# Patient Record
Sex: Female | Born: 1985
Health system: Southern US, Community
[De-identification: ages and names within clinical notes are randomized; demographics above are authoritative.]

## PROBLEM LIST (undated history)

## (undated) DIAGNOSIS — E282 Polycystic ovarian syndrome: Secondary | ICD-10-CM

## (undated) DIAGNOSIS — R42 Dizziness and giddiness: Secondary | ICD-10-CM

## (undated) DIAGNOSIS — R0602 Shortness of breath: Secondary | ICD-10-CM

## (undated) DIAGNOSIS — R12 Heartburn: Secondary | ICD-10-CM

## (undated) DIAGNOSIS — J45909 Unspecified asthma, uncomplicated: Secondary | ICD-10-CM

## (undated) DIAGNOSIS — M549 Dorsalgia, unspecified: Secondary | ICD-10-CM

## (undated) DIAGNOSIS — K30 Functional dyspepsia: Secondary | ICD-10-CM

## (undated) DIAGNOSIS — L509 Urticaria, unspecified: Secondary | ICD-10-CM

## (undated) DIAGNOSIS — F410 Panic disorder [episodic paroxysmal anxiety] without agoraphobia: Secondary | ICD-10-CM

## (undated) DIAGNOSIS — J069 Acute upper respiratory infection, unspecified: Secondary | ICD-10-CM

## (undated) DIAGNOSIS — F329 Major depressive disorder, single episode, unspecified: Secondary | ICD-10-CM

## (undated) DIAGNOSIS — I2510 Atherosclerotic heart disease of native coronary artery without angina pectoris: Secondary | ICD-10-CM

## (undated) DIAGNOSIS — R079 Chest pain, unspecified: Secondary | ICD-10-CM

## (undated) DIAGNOSIS — F419 Anxiety disorder, unspecified: Secondary | ICD-10-CM

## (undated) DIAGNOSIS — F32A Depression, unspecified: Secondary | ICD-10-CM

## (undated) DIAGNOSIS — I10 Essential (primary) hypertension: Secondary | ICD-10-CM

## (undated) DIAGNOSIS — K5900 Constipation, unspecified: Secondary | ICD-10-CM

## (undated) DIAGNOSIS — M7989 Other specified soft tissue disorders: Secondary | ICD-10-CM

## (undated) DIAGNOSIS — R002 Palpitations: Secondary | ICD-10-CM

## (undated) DIAGNOSIS — G43909 Migraine, unspecified, not intractable, without status migrainosus: Secondary | ICD-10-CM

## (undated) DIAGNOSIS — L309 Dermatitis, unspecified: Secondary | ICD-10-CM

## (undated) HISTORY — DX: Dizziness and giddiness: R42

## (undated) HISTORY — DX: Functional dyspepsia: K30

## (undated) HISTORY — DX: Heartburn: R12

## (undated) HISTORY — DX: Panic disorder (episodic paroxysmal anxiety): F41.0

## (undated) HISTORY — DX: Shortness of breath: R06.02

## (undated) HISTORY — DX: Dermatitis, unspecified: L30.9

## (undated) HISTORY — DX: Other specified soft tissue disorders: M79.89

## (undated) HISTORY — DX: Palpitations: R00.2

## (undated) HISTORY — PX: WISDOM TOOTH EXTRACTION: SHX21

## (undated) HISTORY — DX: Polycystic ovarian syndrome: E28.2

## (undated) HISTORY — PX: TONSILLECTOMY: SUR1361

## (undated) HISTORY — DX: Migraine, unspecified, not intractable, without status migrainosus: G43.909

## (undated) HISTORY — DX: Depression, unspecified: F32.A

## (undated) HISTORY — DX: Acute upper respiratory infection, unspecified: J06.9

## (undated) HISTORY — DX: Constipation, unspecified: K59.00

## (undated) HISTORY — DX: Anxiety disorder, unspecified: F41.9

## (undated) HISTORY — DX: Urticaria, unspecified: L50.9

## (undated) HISTORY — DX: Dorsalgia, unspecified: M54.9

## (undated) HISTORY — DX: Chest pain, unspecified: R07.9

---
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
## 2014-06-17 ENCOUNTER — Emergency Department (HOSPITAL_BASED_OUTPATIENT_CLINIC_OR_DEPARTMENT_OTHER)
Admission: EM | Admit: 2014-06-17 | Discharge: 2014-06-17 | Disposition: A | Discharge status: Home / Self Care | Payer: 59 | Attending: Emergency Medicine | Admitting: Emergency Medicine

## 2014-06-17 ENCOUNTER — Encounter (HOSPITAL_BASED_OUTPATIENT_CLINIC_OR_DEPARTMENT_OTHER): Payer: Self-pay

## 2014-06-17 DIAGNOSIS — I10 Essential (primary) hypertension: Secondary | ICD-10-CM | POA: Insufficient documentation

## 2014-06-17 DIAGNOSIS — Y93G1 Activity, food preparation and clean up: Secondary | ICD-10-CM | POA: Insufficient documentation

## 2014-06-17 DIAGNOSIS — S29012A Strain of muscle and tendon of back wall of thorax, initial encounter: Secondary | ICD-10-CM | POA: Insufficient documentation

## 2014-06-17 DIAGNOSIS — Y9289 Other specified places as the place of occurrence of the external cause: Secondary | ICD-10-CM | POA: Insufficient documentation

## 2014-06-17 DIAGNOSIS — Y998 Other external cause status: Secondary | ICD-10-CM | POA: Diagnosis not present

## 2014-06-17 DIAGNOSIS — Z79899 Other long term (current) drug therapy: Secondary | ICD-10-CM | POA: Diagnosis not present

## 2014-06-17 DIAGNOSIS — J45909 Unspecified asthma, uncomplicated: Secondary | ICD-10-CM | POA: Insufficient documentation

## 2014-06-17 DIAGNOSIS — S24109A Unspecified injury at unspecified level of thoracic spinal cord, initial encounter: Secondary | ICD-10-CM | POA: Diagnosis present

## 2014-06-17 DIAGNOSIS — X58XXXA Exposure to other specified factors, initial encounter: Secondary | ICD-10-CM | POA: Diagnosis not present

## 2014-06-17 DIAGNOSIS — S29019A Strain of muscle and tendon of unspecified wall of thorax, initial encounter: Secondary | ICD-10-CM

## 2014-06-17 HISTORY — DX: Unspecified asthma, uncomplicated: J45.909

## 2014-06-17 HISTORY — DX: Essential (primary) hypertension: I10

## 2014-06-17 MED ORDER — METHOCARBAMOL 500 MG PO TABS: 500.0000 mg | ORAL_TABLET | Freq: Two times a day (BID) | ORAL | Status: DC

## 2014-06-17 MED ORDER — HYDROCODONE-ACETAMINOPHEN 5-325 MG PO TABS: 2.0000 | ORAL_TABLET | ORAL | Status: DC | PRN

## 2014-06-17 MED ORDER — HYDROMORPHONE HCL 1 MG/ML IJ SOLN
1.0000 mg | Freq: Once | INTRAMUSCULAR | Status: AC
  Administered 2014-06-17: 1 mg via INTRAMUSCULAR
  Filled 2014-06-17: qty 1

## 2014-06-17 MED ORDER — ONDANSETRON 8 MG PO TBDP
8.0000 mg | ORAL_TABLET | Freq: Once | ORAL | Status: AC
  Administered 2014-06-17: 8 mg via ORAL
  Filled 2014-06-17: qty 1

## 2014-06-17 MED ORDER — DIAZEPAM 5 MG PO TABS
5.0000 mg | ORAL_TABLET | Freq: Once | ORAL | Status: AC
  Administered 2014-06-17: 5 mg via ORAL
  Filled 2014-06-17: qty 1

## 2014-06-17 NOTE — ED Provider Notes (Signed)
CSN: [id number]     Arrival date & time 06/17/14  1000 History   First MD Initiated Contact with Patient 06/17/14 1012     Chief Complaint  Patient presents with  . Back Pain     (Consider location/radiation/quality/duration/timing/severity/associated sxs/prior Treatment) HPI Comments: Patient presents to the ER for evaluation of back pain. Patient reports that she felt a twinge of pain when she turned while washing dishes 2 days ago. Since then, however, the area is tight but has become severe. Patient reports constant dull aching pain in the area, but when she moves in certain positions she has severe, sharp and stabbing pain. No chest pain or shortness of breath.  Patient is a 29 y.o. female presenting with back pain.  Back Pain   Past Medical History  Diagnosis Date  . Asthma   . Hypertension    Past Surgical History  Procedure Laterality Date  . Tonsillectomy     History reviewed. No pertinent family history. History  Substance Use Topics  . Smoking status: Never Smoker   . Smokeless tobacco: Not on file  . Alcohol Use: Yes     Comment: occasionally   OB History    No data available     Review of Systems  Musculoskeletal: Positive for back pain.  All other systems reviewed and are negative.     Allergies  Review of patient's allergies indicates no known allergies.  Home Medications   Prior to Admission medications   Medication Sig Start Date End Date Taking? Authorizing Provider  albuterol (PROVENTIL HFA;VENTOLIN HFA) 108 (90 BASE) MCG/ACT inhaler Inhale into the lungs every 6 (six) hours as needed for wheezing or shortness of breath.   Yes Historical Provider, MD  lisinopril (PRINIVIL,ZESTRIL) 5 MG tablet Take 5 mg by mouth daily.   Yes Historical Provider, MD   BP 134/69 mmHg  Pulse 80  Temp(Src) 98.4 F (36.9 C) (Oral)  Resp 18  Ht 5\' 3"  (1.6 m)  Wt 217 lb (98.431 kg)  BMI 38.45 kg/m2  SpO2 97%  LMP 05/09/2014 Physical Exam  Constitutional: She  is oriented to person, place, and time. She appears well-developed and well-nourished. No distress.  HENT:  Head: Normocephalic and atraumatic.  Right Ear: Hearing normal.  Left Ear: Hearing normal.  Nose: Nose normal.  Mouth/Throat: Oropharynx is clear and moist and mucous membranes are normal.  Eyes: Conjunctivae and EOM are normal. Pupils are equal, round, and reactive to light.  Neck: Normal range of motion. Neck supple.  Cardiovascular: Regular rhythm, S1 normal and S2 normal.  Exam reveals no gallop and no friction rub.   No murmur heard. Pulmonary/Chest: Effort normal and breath sounds normal. No respiratory distress. She exhibits no tenderness.  Abdominal: Soft. Normal appearance and bowel sounds are normal. There is no hepatosplenomegaly. There is no tenderness. There is no rebound, no guarding, no tenderness at McBurney's point and negative Murphy's sign. No hernia.  Musculoskeletal: Normal range of motion.       Thoracic back: She exhibits tenderness.       Back:  Neurological: She is alert and oriented to person, place, and time. She has normal strength. No cranial nerve deficit or sensory deficit. Coordination normal. GCS eye subscore is 4. GCS verbal subscore is 5. GCS motor subscore is 6.  Skin: Skin is warm, dry and intact. No rash noted. No cyanosis.  Psychiatric: She has a normal mood and affect. Her speech is normal and behavior is normal. Thought content normal.  Nursing note and vitals reviewed.   ED Course  Procedures (including critical care time) Labs Review Labs Reviewed - No data to display  Imaging Review No results found.   EKG Interpretation None      MDM   Final diagnoses:  None   thoracic strain  Patient presents to the ER with musculoskeletal back pain. Examination reveals back tenderness without any associated neurologic findings. Patient's strength, sensation and reflexes were normal. As such, patient did not require any imaging or further  studies. Patient was treated with analgesia.    Gilda Crease, MD 06/17/14 1228

## 2014-06-17 NOTE — Discharge Instructions (Signed)
Back Pain, Adult °Back pain is very common. The pain often gets better over time. The cause of back pain is usually not dangerous. Most people can learn to manage their back pain on their own.  °HOME CARE  °· Stay active. Start with short walks on flat ground if you can. Try to walk farther each day. °· Do not sit, drive, or stand in one place for more than 30 minutes. Do not stay in bed. °· Do not avoid exercise or work. Activity can help your back heal faster. °· Be careful when you bend or lift an object. Bend at your knees, keep the object close to you, and do not twist. °· Sleep on a firm mattress. Lie on your side, and bend your knees. If you lie on your back, put a pillow under your knees. °· Only take medicines as told by your doctor. °· Put ice on the injured area. °¨ Put ice in a plastic bag. °¨ Place a towel between your skin and the bag. °¨ Leave the ice on for 15-20 minutes, 03-04 times a day for the first 2 to 3 days. After that, you can switch between ice and heat packs. °· Ask your doctor about back exercises or massage. °· Avoid feeling anxious or stressed. Find good ways to deal with stress, such as exercise. °GET HELP RIGHT AWAY IF:  °· Your pain does not go away with rest or medicine. °· Your pain does not go away in 1 week. °· You have new problems. °· You do not feel well. °· The pain spreads into your legs. °· You cannot control when you poop (bowel movement) or pee (urinate). °· Your arms or legs feel weak or lose feeling (numbness). °· You feel sick to your stomach (nauseous) or throw up (vomit). °· You have belly (abdominal) pain. °· You feel like you may pass out (faint). °MAKE SURE YOU:  °· Understand these instructions. °· Will watch your condition. °· Will get help right away if you are not doing well or get worse. °Document Released: 10/09/2007 Document Revised: 07/15/2011 Document Reviewed: 08/24/2013 °[employer]® Patient Information ©2015 [employer]. This information is not intended  to replace advice given to you by your health care provider. Make sure you discuss any questions you have with your health care provider. ° °

## 2014-06-17 NOTE — ED Notes (Addendum)
Pt reports middle back pain that started this morning - noted pain began when she was washing dishes (twisting motion)

## 2015-04-25 ENCOUNTER — Other Ambulatory Visit: Payer: Self-pay | Admitting: Family Medicine

## 2015-04-25 ENCOUNTER — Ambulatory Visit
Admission: RE | Admit: 2015-04-25 | Discharge: 2015-04-25 | Disposition: A | Discharge status: Home / Self Care | Payer: 59 | Source: Ambulatory Visit | Attending: Family Medicine | Admitting: Family Medicine

## 2015-04-25 DIAGNOSIS — W540XXA Bitten by dog, initial encounter: Secondary | ICD-10-CM

## 2017-02-06 DIAGNOSIS — R350 Frequency of micturition: Secondary | ICD-10-CM | POA: Diagnosis not present

## 2017-02-21 DIAGNOSIS — Z118 Encounter for screening for other infectious and parasitic diseases: Secondary | ICD-10-CM | POA: Diagnosis not present

## 2017-02-21 DIAGNOSIS — N920 Excessive and frequent menstruation with regular cycle: Secondary | ICD-10-CM | POA: Diagnosis not present

## 2017-02-21 DIAGNOSIS — Z0000 Encounter for general adult medical examination without abnormal findings: Secondary | ICD-10-CM | POA: Diagnosis not present

## 2017-02-21 DIAGNOSIS — Z13220 Encounter for screening for lipoid disorders: Secondary | ICD-10-CM | POA: Diagnosis not present

## 2017-02-21 DIAGNOSIS — Z131 Encounter for screening for diabetes mellitus: Secondary | ICD-10-CM | POA: Diagnosis not present

## 2017-02-21 DIAGNOSIS — Z130 Encounter for screening for diseases of the blood and blood-forming organs and certain disorders involving the immune mechanism: Secondary | ICD-10-CM | POA: Diagnosis not present

## 2017-02-21 DIAGNOSIS — Z01419 Encounter for gynecological examination (general) (routine) without abnormal findings: Secondary | ICD-10-CM | POA: Diagnosis not present

## 2017-06-13 DIAGNOSIS — G43109 Migraine with aura, not intractable, without status migrainosus: Secondary | ICD-10-CM | POA: Diagnosis not present

## 2017-06-13 DIAGNOSIS — J45990 Exercise induced bronchospasm: Secondary | ICD-10-CM | POA: Diagnosis not present

## 2018-01-07 DIAGNOSIS — G43109 Migraine with aura, not intractable, without status migrainosus: Secondary | ICD-10-CM | POA: Diagnosis not present

## 2018-01-07 DIAGNOSIS — J45990 Exercise induced bronchospasm: Secondary | ICD-10-CM | POA: Diagnosis not present

## 2018-01-13 DIAGNOSIS — L989 Disorder of the skin and subcutaneous tissue, unspecified: Secondary | ICD-10-CM | POA: Diagnosis not present

## 2018-01-15 DIAGNOSIS — Z6836 Body mass index (BMI) 36.0-36.9, adult: Secondary | ICD-10-CM | POA: Diagnosis not present

## 2018-01-15 DIAGNOSIS — E669 Obesity, unspecified: Secondary | ICD-10-CM | POA: Diagnosis not present

## 2018-02-20 DIAGNOSIS — Z130 Encounter for screening for diseases of the blood and blood-forming organs and certain disorders involving the immune mechanism: Secondary | ICD-10-CM | POA: Diagnosis not present

## 2018-02-20 DIAGNOSIS — Z13220 Encounter for screening for lipoid disorders: Secondary | ICD-10-CM | POA: Diagnosis not present

## 2018-02-20 DIAGNOSIS — Z131 Encounter for screening for diabetes mellitus: Secondary | ICD-10-CM | POA: Diagnosis not present

## 2018-02-20 DIAGNOSIS — Z0000 Encounter for general adult medical examination without abnormal findings: Secondary | ICD-10-CM | POA: Diagnosis not present

## 2018-02-20 DIAGNOSIS — Z1329 Encounter for screening for other suspected endocrine disorder: Secondary | ICD-10-CM | POA: Diagnosis not present

## 2018-02-23 DIAGNOSIS — Z01419 Encounter for gynecological examination (general) (routine) without abnormal findings: Secondary | ICD-10-CM | POA: Diagnosis not present

## 2018-02-23 DIAGNOSIS — Z118 Encounter for screening for other infectious and parasitic diseases: Secondary | ICD-10-CM | POA: Diagnosis not present

## 2018-02-23 DIAGNOSIS — Z6836 Body mass index (BMI) 36.0-36.9, adult: Secondary | ICD-10-CM | POA: Diagnosis not present

## 2018-04-20 DIAGNOSIS — R350 Frequency of micturition: Secondary | ICD-10-CM | POA: Diagnosis not present

## 2018-05-04 DIAGNOSIS — Z118 Encounter for screening for other infectious and parasitic diseases: Secondary | ICD-10-CM | POA: Diagnosis not present

## 2018-05-04 DIAGNOSIS — Z1159 Encounter for screening for other viral diseases: Secondary | ICD-10-CM | POA: Diagnosis not present

## 2018-06-22 ENCOUNTER — Other Ambulatory Visit (HOSPITAL_BASED_OUTPATIENT_CLINIC_OR_DEPARTMENT_OTHER): Payer: Self-pay | Admitting: Obstetrics and Gynecology

## 2018-06-22 ENCOUNTER — Other Ambulatory Visit: Payer: Self-pay | Admitting: Obstetrics and Gynecology

## 2018-06-22 ENCOUNTER — Ambulatory Visit (HOSPITAL_BASED_OUTPATIENT_CLINIC_OR_DEPARTMENT_OTHER)
Admission: RE | Admit: 2018-06-22 | Discharge: 2018-06-22 | Disposition: A | Discharge status: Home / Self Care | Payer: 59 | Source: Ambulatory Visit | Attending: Obstetrics and Gynecology | Admitting: Obstetrics and Gynecology

## 2018-06-22 DIAGNOSIS — R1013 Epigastric pain: Secondary | ICD-10-CM | POA: Diagnosis not present

## 2018-06-23 ENCOUNTER — Other Ambulatory Visit: Payer: Self-pay

## 2018-06-25 ENCOUNTER — Other Ambulatory Visit: Payer: Self-pay | Admitting: Obstetrics and Gynecology

## 2018-06-25 ENCOUNTER — Ambulatory Visit
Admission: RE | Admit: 2018-06-25 | Discharge: 2018-06-25 | Disposition: A | Discharge status: Home / Self Care | Payer: 59 | Source: Ambulatory Visit | Attending: Obstetrics and Gynecology | Admitting: Obstetrics and Gynecology

## 2018-06-25 DIAGNOSIS — R0602 Shortness of breath: Secondary | ICD-10-CM | POA: Diagnosis not present

## 2018-06-25 DIAGNOSIS — R079 Chest pain, unspecified: Secondary | ICD-10-CM

## 2018-06-26 DIAGNOSIS — R1011 Right upper quadrant pain: Secondary | ICD-10-CM | POA: Diagnosis not present

## 2018-06-26 DIAGNOSIS — R079 Chest pain, unspecified: Secondary | ICD-10-CM | POA: Diagnosis not present

## 2018-07-01 ENCOUNTER — Other Ambulatory Visit: Payer: Self-pay | Admitting: Obstetrics and Gynecology

## 2018-07-02 ENCOUNTER — Other Ambulatory Visit: Payer: Self-pay | Admitting: Gastroenterology

## 2018-07-02 DIAGNOSIS — R1011 Right upper quadrant pain: Secondary | ICD-10-CM | POA: Diagnosis not present

## 2018-07-02 DIAGNOSIS — K219 Gastro-esophageal reflux disease without esophagitis: Secondary | ICD-10-CM | POA: Diagnosis not present

## 2018-07-13 ENCOUNTER — Ambulatory Visit (HOSPITAL_COMMUNITY): Payer: 59

## 2018-07-17 ENCOUNTER — Other Ambulatory Visit: Payer: Self-pay

## 2018-07-17 ENCOUNTER — Encounter (HOSPITAL_COMMUNITY)
Admission: RE | Admit: 2018-07-17 | Discharge: 2018-07-17 | Disposition: A | Discharge status: Home / Self Care | Payer: 59 | Source: Ambulatory Visit | Attending: Gastroenterology | Admitting: Gastroenterology

## 2018-07-17 DIAGNOSIS — R1010 Upper abdominal pain, unspecified: Secondary | ICD-10-CM | POA: Diagnosis not present

## 2018-07-17 DIAGNOSIS — R1011 Right upper quadrant pain: Secondary | ICD-10-CM | POA: Diagnosis present

## 2018-07-17 MED ORDER — TECHNETIUM TC 99M MEBROFENIN IV KIT
5.3000 | PACK | Freq: Once | INTRAVENOUS | Status: AC | PRN
  Administered 2018-07-17: 5.3 via INTRAVENOUS

## 2018-08-17 DIAGNOSIS — R6889 Other general symptoms and signs: Secondary | ICD-10-CM | POA: Diagnosis not present

## 2018-08-17 DIAGNOSIS — J302 Other seasonal allergic rhinitis: Secondary | ICD-10-CM | POA: Diagnosis not present

## 2018-08-17 DIAGNOSIS — J9801 Acute bronchospasm: Secondary | ICD-10-CM | POA: Diagnosis not present

## 2019-03-29 ENCOUNTER — Encounter (INDEPENDENT_AMBULATORY_CARE_PROVIDER_SITE_OTHER): Payer: Self-pay | Admitting: Family Medicine

## 2019-03-29 ENCOUNTER — Ambulatory Visit (INDEPENDENT_AMBULATORY_CARE_PROVIDER_SITE_OTHER): Payer: BC Managed Care – PPO | Admitting: Family Medicine

## 2019-03-29 ENCOUNTER — Other Ambulatory Visit: Payer: Self-pay

## 2019-03-29 VITALS — BP 138/79 | HR 95 | Temp 98.0°F | Ht 64.0 in | Wt 265.0 lb

## 2019-03-29 DIAGNOSIS — R632 Polyphagia: Secondary | ICD-10-CM

## 2019-03-29 DIAGNOSIS — G4730 Sleep apnea, unspecified: Secondary | ICD-10-CM

## 2019-03-29 DIAGNOSIS — F3289 Other specified depressive episodes: Secondary | ICD-10-CM | POA: Diagnosis not present

## 2019-03-29 DIAGNOSIS — Z6841 Body Mass Index (BMI) 40.0 and over, adult: Secondary | ICD-10-CM

## 2019-03-29 DIAGNOSIS — Z9189 Other specified personal risk factors, not elsewhere classified: Secondary | ICD-10-CM

## 2019-03-29 DIAGNOSIS — E282 Polycystic ovarian syndrome: Secondary | ICD-10-CM | POA: Diagnosis not present

## 2019-03-29 DIAGNOSIS — R5383 Other fatigue: Secondary | ICD-10-CM

## 2019-03-29 DIAGNOSIS — R0602 Shortness of breath: Secondary | ICD-10-CM | POA: Diagnosis not present

## 2019-03-29 DIAGNOSIS — Z0289 Encounter for other administrative examinations: Secondary | ICD-10-CM

## 2019-03-29 MED ORDER — PHENTERMINE HCL 37.5 MG PO TABS: 37.5000 mg | ORAL_TABLET | Freq: Every day | ORAL | 0 refills | Status: DC

## 2019-03-30 ENCOUNTER — Encounter (INDEPENDENT_AMBULATORY_CARE_PROVIDER_SITE_OTHER): Payer: Self-pay | Admitting: Family Medicine

## 2019-03-30 LAB — CBC WITH DIFFERENTIAL/PLATELET
Basophils Absolute: 0 10*3/uL (ref 0.0–0.2)
Basos: 1 %
EOS (ABSOLUTE): 0.1 10*3/uL (ref 0.0–0.4)
Eos: 2 %
Hematocrit: 44.9 % (ref 34.0–46.6)
Hemoglobin: 15.4 g/dL (ref 11.1–15.9)
Immature Grans (Abs): 0.1 10*3/uL (ref 0.0–0.1)
Immature Granulocytes: 1 %
Lymphocytes Absolute: 1.9 10*3/uL (ref 0.7–3.1)
Lymphs: 30 %
MCH: 31.5 pg (ref 26.6–33.0)
MCHC: 34.3 g/dL (ref 31.5–35.7)
MCV: 92 fL (ref 79–97)
Monocytes Absolute: 0.5 10*3/uL (ref 0.1–0.9)
Monocytes: 8 %
Neutrophils Absolute: 3.7 10*3/uL (ref 1.4–7.0)
Neutrophils: 58 %
Platelets: 233 10*3/uL (ref 150–450)
RBC: 4.89 x10E6/uL (ref 3.77–5.28)
RDW: 13.9 % (ref 11.7–15.4)
WBC: 6.4 10*3/uL (ref 3.4–10.8)

## 2019-03-30 LAB — COMPREHENSIVE METABOLIC PANEL
ALT: 99 IU/L — ABNORMAL HIGH (ref 0–32)
AST: 67 IU/L — ABNORMAL HIGH (ref 0–40)
Albumin/Globulin Ratio: 1.8 (ref 1.2–2.2)
Albumin: 4.2 g/dL (ref 3.8–4.8)
Alkaline Phosphatase: 72 IU/L (ref 39–117)
BUN/Creatinine Ratio: 12 (ref 9–23)
BUN: 9 mg/dL (ref 6–20)
Bilirubin Total: 0.4 mg/dL (ref 0.0–1.2)
CO2: 20 mmol/L (ref 20–29)
Calcium: 9 mg/dL (ref 8.7–10.2)
Chloride: 101 mmol/L (ref 96–106)
Creatinine, Ser: 0.76 mg/dL (ref 0.57–1.00)
GFR calc Af Amer: 121 mL/min/{1.73_m2} (ref >59)
GFR calc non Af Amer: 105 mL/min/{1.73_m2} (ref >59)
Globulin, Total: 2.3 g/dL (ref 1.5–4.5)
Glucose: 88 mg/dL (ref 65–99)
Potassium: 4.1 mmol/L (ref 3.5–5.2)
Sodium: 138 mmol/L (ref 134–144)
Total Protein: 6.5 g/dL (ref 6.0–8.5)

## 2019-03-30 LAB — T4, FREE: Free T4: 1.08 ng/dL (ref 0.82–1.77)

## 2019-03-30 LAB — INSULIN, RANDOM: INSULIN: 26 u[IU]/mL — ABNORMAL HIGH (ref 2.6–24.9)

## 2019-03-30 LAB — LIPID PANEL WITH LDL/HDL RATIO
Cholesterol, Total: 208 mg/dL — ABNORMAL HIGH (ref 100–199)
HDL: 56 mg/dL (ref >39)
LDL Chol Calc (NIH): 124 mg/dL — ABNORMAL HIGH (ref 0–99)
LDL/HDL Ratio: 2.2 ratio (ref 0.0–3.2)
Triglycerides: 159 mg/dL — ABNORMAL HIGH (ref 0–149)
VLDL Cholesterol Cal: 28 mg/dL (ref 5–40)

## 2019-03-30 LAB — T3: T3, Total: 144 ng/dL (ref 71–180)

## 2019-03-30 LAB — VITAMIN B12: Vitamin B-12: 1214 pg/mL (ref 232–1245)

## 2019-03-30 LAB — HEMOGLOBIN A1C
Est. average glucose Bld gHb Est-mCnc: 94 mg/dL
Hgb A1c MFr Bld: 4.9 % (ref 4.8–5.6)

## 2019-03-30 LAB — TSH: TSH: 1.8 u[IU]/mL (ref 0.450–4.500)

## 2019-03-30 LAB — VITAMIN D 25 HYDROXY (VIT D DEFICIENCY, FRACTURES): Vit D, 25-Hydroxy: 20.9 ng/mL — ABNORMAL LOW (ref 30.0–100.0)

## 2019-03-30 NOTE — Progress Notes (Signed)
Office: [phone]  /  Fax: [phone]   Dear Dr. Cherly Hensenousins,   Thank you for referring Karla Villa to our clinic. The following note includes my evaluation and treatment recommendations.  HPI:   Chief Complaint: OBESITY    Karla Villa has been referred by Maxie BetterSheronette Cousins, MD for consultation regarding her obesity and obesity related comorbidities.    Karla JamesKatelin E Lemoine (MR# [id number]) is a 33 y.o. female who presents on 03/29/2019 for obesity evaluation and treatment. Current BMI is Body mass index is 45.49 kg/m. Karla Villa has been struggling with her weight for many years and has been unsuccessful in either losing weight, maintaining weight loss, or reaching her healthy weight goal.     Karla Villa states she is currently in the action stage of change and ready to dedicate time achieving and maintaining a healthier weight. Karla Villa is interested in becoming our patient and working on intensive lifestyle modifications including (but not limited to) diet, exercise and weight loss.    Karla Villa states her family eats meals together she thinks her family will eat healthier with  her her desired weight loss is 85-90 lbs she has been heavy most of  her life she started gaining weight in March 2020 her heaviest weight ever was 272 lbs she is a picky eater and doesn't like to eat healthier foods  she has significant food cravings issues  she snacks frequently in the evenings she skips meals frequently she is frequently drinking liquids with calories she frequently makes poor food choices she has problems with excessive hunger  she frequently eats larger portions than normal  she has binge eating behaviors she struggles with emotional eating    Fatigue [name] feels her energy is lower than it should be. This has worsened with weight gain and has not worsened recently. [name] admits to daytime somnolence and  admits to waking up still tired. Patient is at risk for obstructive  sleep apnea. Patent has a history of symptoms of daytime fatigue and morning headache. Patient generally gets 5 or 6 hours of sleep per night, and states they generally have nightime awakenings. Snoring is present. Apneic episodes are not present. Epworth Sleepiness Score is 9.  Dyspnea on exertion [name] notes increasing shortness of breath with exercising and seems to be worsening over time with weight gain. She notes getting out of breath sooner with activity than she used to. This has not gotten worse recently. [name] denies orthopnea.  Polycystic Ovarian Syndrome Karla Villa has a diagnosis of PCOS. She understands that PCOS increases the risk of hyperglycemia and insulin resistance. She did well on metformin in the past.  At risk for diabetes Karla Villa is at higher than average risk for developing diabetes due to her obesity and PCOS. She currently denies polyuria or polydipsia.  Hyperphagia [name] has hyperphagia and has been on phentermine 37.5 mg daily for >1 month. Her last pill was today. In order to avoid withdrawal, we would need to refill for 1 month with instruction for her to taper off.  Sleep Disorder Breathing Karla Villa has a sleep disorder breathing. Her epworth score is 9. She notes snoring and morning headaches.  Depression with Emotional Eating Behaviors Karla Villa is struggling with emotional eating and using food for comfort to the extent that it is negatively impacting her health. She often snacks when she is not hungry. Karla Villa sometimes feels she is out of control and then feels guilty that she made poor food choices. She has been working on behavior modification  techniques to help reduce her emotional eating and has been somewhat successful. She shows no sign of suicidal or homicidal ideations.  Depression Screen Karla Villa's Food and Mood (modified PHQ-9) score was  Depression screen PHQ 2/9 03/29/2019  Decreased Interest 2  Down, Depressed, Hopeless 3  PHQ - 2 Score 5    Altered sleeping 2  Tired, decreased energy 2  Change in appetite 2  Feeling bad or failure about yourself  3  Trouble concentrating 2  Moving slowly or fidgety/restless 1  Suicidal thoughts 0  PHQ-9 Score 17  Difficult doing work/chores Somewhat difficult    ASSESSMENT AND PLAN:  Other fatigue - Plan: EKG 12-Lead, B12, CBC w/Diff/Platelet, T3, T4, free, TSH, Vitamin D (25 hydroxy)  Shortness of breath on exertion - Plan: Lipid Panel With LDL/HDL Ratio  PCOS (polycystic ovarian syndrome) - Plan: Comprehensive Metabolic Panel (CMET), HgB A1c, Insulin, random  Hyperphagia - Plan: phentermine (ADIPEX-P) 37.5 MG tablet  Other depression, emotional eating  Sleep disorder breathing  At risk for diabetes mellitus  Class 3 severe obesity with serious comorbidity and body mass index (BMI) of 45.0 to 49.9 in adult, unspecified obesity type (HCC)  PLAN:  Fatigue Karla Villa was informed that her fatigue may be related to obesity, depression or many other causes. Labs will be ordered, and in the meanwhile Karla Villa has agreed to work on diet, exercise and weight loss to help with fatigue. Proper sleep hygiene was discussed including the need for 7-8 hours of quality sleep each night. A sleep study was not ordered based on symptoms and Epworth score.  Dyspnea on exertion Karla Villa's shortness of breath appears to be obesity related and exercise induced. She has agreed to work on weight loss and gradually increase exercise to treat her exercise induced shortness of breath. If Karla Villa follows our instructions and loses weight without improvement of her shortness of breath, we will plan to refer to pulmonology. We will monitor this condition regularly. Karla Villa agrees to this plan.  Polycystic Ovarian Syndrome We will check labs and restart metformin at her next visit. Karla Villa agrees to follow up with our clinic in 2 weeks.  Hyperphagia Karla Villa agrees to continue taking phentermine 37.5 mg q AM  before breakfast #30 and we will refill for 1 month. Karla Villa agrees to follow up with our clinic in 2 weeks.  Sleep Disorder Breathing We will continue to monitor and we will order a sleep study if not improving with weight loss.   Depression with Emotional Eating Behaviors We discussed behavior modification techniques today to help Jacklyn deal with her emotional eating and depression. We will refer to Dr. Dewaine Conger, our Bariatric Psychologist for evaluation.  Depression Screen Arryanna had a strongly positive depression screening. Depression is commonly associated with obesity and often results in emotional eating behaviors. We will monitor this closely and work on CBT to help improve the non-hunger eating patterns. Referral to Psychology may be required if no improvement is seen as she continues in our clinic.  Obesity Baylen is currently in the action stage of change and her goal is to continue with weight loss efforts. I recommend Gerene begin the structured treatment plan as follows:  She has agreed to follow the Category 1 plan Jauna has been instructed to eventually work up to a goal of 150 minutes of combined cardio and strengthening exercise per week or as tolerated for weight loss and overall health benefits. We discussed the following Behavioral Modification Strategies today: increasing lean protein intake, decreasing simple  carbohydrates, increasing vegetables, increase H20 intake, work on meal planning and easy cooking plans, holiday eating strategies  and decrease liquid calories   She was informed of the importance of frequent follow up visits to maximize her success with intensive lifestyle modifications for her multiple health conditions. She was informed we would discuss her lab results at her next visit unless there is a critical issue that needs to be addressed sooner. Manuela agreed to keep her next visit at the agreed upon time to discuss these  results.  ALLERGIES: Allergies  Allergen Reactions  . Doxycycline Nausea And Vomiting  . Lamisil [Terbinafine Hcl] Other (See Comments)    Headaches    MEDICATIONS: Current Outpatient Medications on File Prior to Visit  Medication Sig Dispense Refill  . acetaminophen (TYLENOL) 325 MG tablet Take by mouth every 6 (six) hours as needed.    Marland Kitchen albuterol (PROVENTIL HFA;VENTOLIN HFA) 108 (90 BASE) MCG/ACT inhaler Inhale into the lungs every 6 (six) hours as needed for wheezing or shortness of breath.    . ALPRAZolam (XANAX) 0.25 MG tablet Take 0.25 mg by mouth at bedtime as needed for anxiety.    Marland Kitchen ibuprofen (ADVIL) 200 MG tablet Take 200 mg by mouth every 6 (six) hours as needed.    . loratadine (CLARITIN) 10 MG tablet Take 10 mg by mouth daily.    . SERTRALINE HCL PO Take 75 mg by mouth.    . valACYclovir (VALTREX) 1000 MG tablet Take 1,000 mg by mouth 2 (two) times daily. PRN    . HYDROcodone-acetaminophen (NORCO/VICODIN) 5-325 MG per tablet Take 2 tablets by mouth every 4 (four) hours as needed for moderate pain. 20 tablet 0  . lisinopril (PRINIVIL,ZESTRIL) 5 MG tablet Take 5 mg by mouth daily.    . methocarbamol (ROBAXIN) 500 MG tablet Take 1 tablet (500 mg total) by mouth 2 (two) times daily. 20 tablet 0   No current facility-administered medications on file prior to visit.     PAST MEDICAL HISTORY: Past Medical History:  Diagnosis Date  . Anxiety   . Asthma   . Back pain   . Bilateral swelling of feet   . Chest pain   . Constipation   . Depression   . Eczema   . Heartburn   . Hypertension   . Indigestion   . Migraines   . Palpitations   . Panic attacks   . PCOS (polycystic ovarian syndrome)   . Shortness of breath   . Vertigo     PAST SURGICAL HISTORY: Past Surgical History:  Procedure Laterality Date  . TONSILLECTOMY    . WISDOM TOOTH EXTRACTION      SOCIAL HISTORY: Social History   Tobacco Use  . Smoking status: Never Smoker  . Smokeless tobacco: Never  Used  Substance Use Topics  . Alcohol use: Yes    Comment: occasionally  . Drug use: Not on file    FAMILY HISTORY: No family history on file.  ROS: Review of Systems  Constitutional: Positive for malaise/fatigue. Negative for weight loss.       + Trouble sleeping  HENT: Positive for sinus pain.        + Nasal stuffiness  Eyes:       + Vision changes  Respiratory: Positive for shortness of breath (with exertion).   Cardiovascular: Positive for palpitations. Negative for orthopnea.  Gastrointestinal: Positive for heartburn.  Musculoskeletal: Positive for back pain.  Skin: Positive for itching and rash.       +  Dryness  Neurological: Positive for dizziness and headaches.  Endo/Heme/Allergies: Bruises/bleeds easily.  Psychiatric/Behavioral: Positive for depression. Negative for suicidal ideas. The patient is nervous/anxious.        + Stress    PHYSICAL EXAM: Blood pressure 138/79, pulse 95, temperature 98 F (36.7 C), temperature source Oral, height 5\' 4"  (1.626 m), weight 265 lb (120.2 kg), last menstrual period 03/09/2019, SpO2 99 %. Body mass index is 45.49 kg/m. Physical Exam Vitals signs reviewed.  Constitutional:      Appearance: Normal appearance. She is obese.  HENT:     Head: Normocephalic and atraumatic.     Nose: Nose normal.  Eyes:     General: No scleral icterus.    Extraocular Movements: Extraocular movements intact.  Neck:     Musculoskeletal: Normal range of motion and neck supple.     Comments: No thyromegaly present Cardiovascular:     Rate and Rhythm: Normal rate and regular rhythm.     Pulses: Normal pulses.     Heart sounds: Normal heart sounds.  Pulmonary:     Effort: Pulmonary effort is normal. No respiratory distress.     Breath sounds: Normal breath sounds.  Abdominal:     Palpations: Abdomen is soft.     Tenderness: There is no abdominal tenderness.     Comments: + Obesity  Musculoskeletal: Normal range of motion.     Right lower leg:  No edema.     Left lower leg: No edema.  Skin:    General: Skin is warm and dry.  Neurological:     Mental Status: She is alert and oriented to person, place, and time.     Coordination: Coordination normal.  Psychiatric:        Mood and Affect: Mood normal.        Behavior: Behavior normal.     RECENT LABS AND TESTS: BMET    Component Value Date/Time   NA 138 03/29/2019 0956   K 4.1 03/29/2019 0956   CL 101 03/29/2019 0956   CO2 20 03/29/2019 0956   GLUCOSE 88 03/29/2019 0956   BUN 9 03/29/2019 0956   CREATININE 0.76 03/29/2019 0956   CALCIUM 9.0 03/29/2019 0956   GFRNONAA 105 03/29/2019 0956   GFRAA 121 03/29/2019 0956   Lab Results  Component Value Date   HGBA1C 4.9 03/29/2019   Lab Results  Component Value Date   INSULIN WILL FOLLOW 03/29/2019   CBC    Component Value Date/Time   WBC 6.4 03/29/2019 0956   RBC 4.89 03/29/2019 0956   HGB 15.4 03/29/2019 0956   HCT 44.9 03/29/2019 0956   PLT 233 03/29/2019 0956   MCV 92 03/29/2019 0956   MCH 31.5 03/29/2019 0956   MCHC 34.3 03/29/2019 0956   RDW 13.9 03/29/2019 0956   LYMPHSABS 1.9 03/29/2019 0956   EOSABS 0.1 03/29/2019 0956   BASOSABS 0.0 03/29/2019 0956   Iron/TIBC/Ferritin/ %Sat No results found for: IRON, TIBC, FERRITIN, IRONPCTSAT Lipid Panel     Component Value Date/Time   CHOL 208 (H) 03/29/2019 0956   TRIG 159 (H) 03/29/2019 0956   HDL 56 03/29/2019 0956   LDLCALC 124 (H) 03/29/2019 0956   Hepatic Function Panel     Component Value Date/Time   PROT 6.5 03/29/2019 0956   ALBUMIN 4.2 03/29/2019 0956   AST 67 (H) 03/29/2019 0956   ALT 99 (H) 03/29/2019 0956   ALKPHOS 72 03/29/2019 0956   BILITOT 0.4 03/29/2019 8242  Component Value Date/Time   TSH 1.800 03/29/2019 0956    ECG  shows NSR with a rate of 94 BPM INDIRECT CALORIMETER done today shows a VO2 of 188 and a REE of 1311.  Her calculated basal metabolic rate is 9147 thus her basal metabolic rate is worse than  expected.       OBESITY BEHAVIORAL INTERVENTION VISIT  Today's visit was # 1   Starting weight: 265 lbs Starting date: 03/29/2019 Today's weight : 265 lbs Today's date: 03/29/2019 Total lbs lost to date: 0    ASK: We discussed the diagnosis of obesity with Karla James today and Deryl agreed to give [place] permission to discuss obesity behavioral modification therapy today.  ASSESS: Cayli has the diagnosis of obesity and her BMI today is 45.46 Tearra is in the action stage of change   ADVISE: Brindy was educated on the multiple health risks of obesity as well as the benefit of weight loss to improve her health. She was advised of the need for long term treatment and the importance of lifestyle modifications to improve her current health and to decrease her risk of future health problems.  AGREE: Multiple dietary modification options and treatment options were discussed and  Charlene agreed to follow the recommendations documented in the above note.  ARRANGE: Curry was educated on the importance of frequent visits to treat obesity as outlined per CMS and USPSTF guidelines and agreed to schedule her next follow up appointment today.  Trude Mcburney, am acting as transcriptionist for Helane Rima, DO  I have reviewed the above documentation for accuracy and completeness, and I agree with the above. Helane Rima, DO

## 2019-04-07 ENCOUNTER — Encounter (INDEPENDENT_AMBULATORY_CARE_PROVIDER_SITE_OTHER): Payer: Self-pay | Admitting: Family Medicine

## 2019-04-08 ENCOUNTER — Encounter (INDEPENDENT_AMBULATORY_CARE_PROVIDER_SITE_OTHER): Payer: Self-pay | Admitting: Family Medicine

## 2019-04-08 NOTE — Telephone Encounter (Signed)
Please review

## 2019-04-12 ENCOUNTER — Encounter (INDEPENDENT_AMBULATORY_CARE_PROVIDER_SITE_OTHER): Payer: Self-pay | Admitting: Family Medicine

## 2019-04-12 ENCOUNTER — Ambulatory Visit (INDEPENDENT_AMBULATORY_CARE_PROVIDER_SITE_OTHER): Payer: BC Managed Care – PPO | Admitting: Family Medicine

## 2019-04-12 ENCOUNTER — Other Ambulatory Visit: Payer: Self-pay

## 2019-04-12 VITALS — BP 138/82 | HR 99 | Temp 98.1°F | Ht 64.0 in | Wt 264.0 lb

## 2019-04-12 DIAGNOSIS — Z6841 Body Mass Index (BMI) 40.0 and over, adult: Secondary | ICD-10-CM

## 2019-04-12 DIAGNOSIS — E559 Vitamin D deficiency, unspecified: Secondary | ICD-10-CM

## 2019-04-12 DIAGNOSIS — E7849 Other hyperlipidemia: Secondary | ICD-10-CM | POA: Diagnosis not present

## 2019-04-12 DIAGNOSIS — E8881 Metabolic syndrome: Secondary | ICD-10-CM | POA: Diagnosis not present

## 2019-04-12 DIAGNOSIS — Z9189 Other specified personal risk factors, not elsewhere classified: Secondary | ICD-10-CM | POA: Diagnosis not present

## 2019-04-12 DIAGNOSIS — R7989 Other specified abnormal findings of blood chemistry: Secondary | ICD-10-CM

## 2019-04-12 DIAGNOSIS — G43809 Other migraine, not intractable, without status migrainosus: Secondary | ICD-10-CM

## 2019-04-12 MED ORDER — VITAMIN D (ERGOCALCIFEROL) 1.25 MG (50000 UNIT) PO CAPS
50000.0000 [IU] | ORAL_CAPSULE | ORAL | 0 refills | Status: DC
Start: 2019-04-12 — End: 2019-04-28

## 2019-04-13 ENCOUNTER — Encounter (INDEPENDENT_AMBULATORY_CARE_PROVIDER_SITE_OTHER): Payer: Self-pay | Admitting: Family Medicine

## 2019-04-13 NOTE — Progress Notes (Signed)
Office: [phone]  /  Fax: [phone]   HPI:   Chief Complaint: OBESITY Karla Villa is here to discuss her progress with her obesity treatment plan. She is on the Category 1 plan and is following her eating plan approximately 50 % of the time. She states she is walking 12,000-14,000 steps 5 times per week. [name] notes increased stress at work (short staffed), and at home. She is not sleeping well. She notes increased migraines (previously on Topamax). She denies polyphagia. Her hunger is satisfied on the meal plan. She states her pms is making her mood low. She was previously on OCPS until last month. She is thinking about conception in March 2021. Her weight is 264 lb (119.7 kg) today and has had a weight loss of 1 pound over a period of 2 weeks since her last visit. She has lost 1 lb since starting treatment with [place].  Vitamin D Deficiency [name] has a diagnosis of vitamin D deficiency. She is not on Vit D and last Vit D level was 20.9. She denies nausea, vomiting or muscle weakness.  Insulin Resistance [name] has a diagnosis of insulin resistance based on her elevated fasting insulin level >5. Last insulin level was 26.0. Although [name]'s blood glucose readings are still under good control, insulin resistance puts her at greater risk of metabolic syndrome and diabetes. She continues to work on diet and exercise to decrease risk of diabetes.  At risk for diabetes Shatha is at higher than average risk for developing diabetes due to her obesity and insulin resistance.   Elevated LFT's Golda has a diagnosis of elevated LFT. She is taking Tylenol PM. Her BMI is over 40. She denies abdominal pain or jaundice and has never been told of any liver problems in the past. She denies excessive alcohol intake.  Hyperlipidemia Richanda has a diagnosis of hyperlipidemia. Last LDL was 124 and triglycerides of 159. She has been trying to improve her cholesterol levels with intensive lifestyle  modification including a low saturated fat diet, exercise and weight loss. She denies any chest pain, claudication or myalgias.  Migraines Martika complains of migraines. She was previously on Topamax 50 mg PO BID and stopped approximately 6 month ago. She did not see her primary care physician. She notes it was helpful at the time for migraines only at 50 mg PO qhs.  ASSESSMENT AND PLAN:  Vitamin D deficiency - Plan: Vitamin D, Ergocalciferol, (DRISDOL) 1.25 MG (50000 UT) CAPS capsule  Insulin resistance  Other hyperlipidemia  Elevated LFTs  Other migraine without status migrainosus, not intractable  At risk for diabetes mellitus  Class 3 severe obesity with serious comorbidity and body mass index (BMI) of 45.0 to 49.9 in adult, unspecified obesity type (HCC)  PLAN:  Vitamin D Deficiency Low vitamin D level contributes to fatigue and are associated with obesity, breast, and colon cancer. Karla Villa agrees to start prescription Vit D 50,000 IU every week #4 with no refills. She will follow up for routine testing of vitamin D, at least 2-3 times per year to avoid over-replacement. Karla Villa agrees to follow up with our clinic in 2 to 3 weeks.  Insulin Resistance Karla Villa will continue to work on weight loss, exercise, and decreasing simple carbohydrates to help decrease the risk of diabetes. We discussed medications options going forward. Karla Villa agrees to follow up with [place] as directed to closely monitor her progress.  Diabetes risk counseling (~15 min) Karla Villa is a 33 y.o. female and has risk factors for diabetes including  obesity and insulin resistance. We discussed intensive lifestyle modifications today with an emphasis on weight loss as well as increasing exercise and decreasing simple carbohydrates in her diet.  Elevated LFT's We discussed the likely diagnosis of non alcoholic fatty liver disease today and how this condition is obesity related. Karla Villa was educated the importance of  weight loss. Karla Villa agreed to continue with her weight loss efforts with healthier diet and exercise as an essential part of her treatment plan. Karla Villa is to stop Tylenol and we will recheck labs in 2 months.  Hyperlipidemia Intensive lifestyle modifications as the first line treatment for hyperlipidemia. We discussed many lifestyle modifications today and Terrence will continue to work on diet, exercise and weight loss efforts, and we will continue to monitor.  Migraines Daffney agrees to restart Topamax with a goal of 50 mg PO BID. Cythia agrees to follow up with our clinic in 2 to 3 weeks.  Obesity Karla Villa is currently in the action stage of change. As such, her goal is to continue with weight loss efforts She has agreed to follow the Category 1 plan Karla Villa has been instructed to work up to a goal of 150 minutes of combined cardio and strengthening exercise per week for weight loss and overall health benefits. We discussed the following Behavioral Modification Strategies today: increasing lean protein intake, decreasing simple carbohydrates, increasing vegetables, increase H20 intake, work on meal planning and easy cooking plans and holiday eating strategies    Karla Villa has agreed to follow up with our clinic in 2 to 3 weeks. She was informed of the importance of frequent follow up visits to maximize her success with intensive lifestyle modifications for her multiple health conditions.  ALLERGIES: Allergies  Allergen Reactions  . Doxycycline Nausea And Vomiting  . Lamisil [Terbinafine Hcl] Other (See Comments)    Headaches    MEDICATIONS: Current Outpatient Medications on File Prior to Visit  Medication Sig Dispense Refill  . acetaminophen (TYLENOL) 325 MG tablet Take by mouth every 6 (six) hours as needed.    Marland Kitchen albuterol (PROVENTIL HFA;VENTOLIN HFA) 108 (90 BASE) MCG/ACT inhaler Inhale into the lungs every 6 (six) hours as needed for wheezing or shortness of breath.    .  ALPRAZolam (XANAX) 0.25 MG tablet Take 0.25 mg by mouth at bedtime as needed for anxiety.    . Biotin w/ Vitamins C & E (HAIR/SKIN/NAILS PO) Take by mouth.    Marland Kitchen ibuprofen (ADVIL) 200 MG tablet Take 200 mg by mouth every 6 (six) hours as needed.    . loratadine (CLARITIN) 10 MG tablet Take 10 mg by mouth daily.    . SERTRALINE HCL PO Take 75 mg by mouth.    . valACYclovir (VALTREX) 1000 MG tablet Take 1,000 mg by mouth 2 (two) times daily. PRN    . HYDROcodone-acetaminophen (NORCO/VICODIN) 5-325 MG per tablet Take 2 tablets by mouth every 4 (four) hours as needed for moderate pain. 20 tablet 0  . lisinopril (PRINIVIL,ZESTRIL) 5 MG tablet Take 5 mg by mouth daily.    . methocarbamol (ROBAXIN) 500 MG tablet Take 1 tablet (500 mg total) by mouth 2 (two) times daily. 20 tablet 0  . phentermine (ADIPEX-P) 37.5 MG tablet Take 1 tablet (37.5 mg total) by mouth daily before breakfast. (Patient not taking: Reported on 04/12/2019) 30 tablet 0   No current facility-administered medications on file prior to visit.     PAST MEDICAL HISTORY: Past Medical History:  Diagnosis Date  . Anxiety   .  Asthma   . Back pain   . Bilateral swelling of feet   . Chest pain   . Constipation   . Depression   . Eczema   . Heartburn   . Hypertension   . Indigestion   . Migraines   . Palpitations   . Panic attacks   . PCOS (polycystic ovarian syndrome)   . Shortness of breath   . Vertigo     PAST SURGICAL HISTORY: Past Surgical History:  Procedure Laterality Date  . TONSILLECTOMY    . WISDOM TOOTH EXTRACTION      SOCIAL HISTORY: Social History   Tobacco Use  . Smoking status: Never Smoker  . Smokeless tobacco: Never Used  Substance Use Topics  . Alcohol use: Yes    Comment: occasionally  . Drug use: Not on file    FAMILY HISTORY: History reviewed. No pertinent family history.  ROS: Review of Systems  Constitutional: Positive for weight loss.  Eyes:       Negative jaundice  Cardiovascular:  Negative for chest pain and claudication.  Gastrointestinal: Negative for abdominal pain, nausea and vomiting.  Genitourinary: Negative for frequency.  Musculoskeletal: Negative for myalgias.       Negative muscle weakness  Neurological: Positive for headaches.  Endo/Heme/Allergies: Negative for polydipsia.       Negative polyphagia    PHYSICAL EXAM: Blood pressure 138/82, pulse 99, temperature 98.1 F (36.7 C), temperature source Oral, height  (1.626 m), weight 264 lb (119.7 kg), SpO2 99 %. Body mass index is 45.32 kg/m. Physical Exam Vitals signs reviewed.  Constitutional:      Appearance: Normal appearance. She is obese.  Cardiovascular:     Rate and Rhythm: Normal rate.     Pulses: Normal pulses.  Pulmonary:     Effort: Pulmonary effort is normal.     Breath sounds: Normal breath sounds.  Musculoskeletal: Normal range of motion.  Skin:    General: Skin is warm and dry.  Neurological:     Mental Status: She is alert and oriented to person, place, and time.  Psychiatric:        Mood and Affect: Mood normal.        Behavior: Behavior normal.     RECENT LABS AND TESTS: BMET    Component Value Date/Time   NA 138 03/29/2019 0956   K 4.1 03/29/2019 0956   CL 101 03/29/2019 0956   CO2 20 03/29/2019 0956   GLUCOSE 88 03/29/2019 0956   BUN 9 03/29/2019 0956   CREATININE 0.76 03/29/2019 0956   CALCIUM 9.0 03/29/2019 0956   GFRNONAA 105 03/29/2019 0956   GFRAA 121 03/29/2019 0956   Lab Results  Component Value Date   HGBA1C 4.9 03/29/2019   Lab Results  Component Value Date   INSULIN 26.0 (H) 03/29/2019   CBC    Component Value Date/Time   WBC 6.4 03/29/2019 0956   RBC 4.89 03/29/2019 0956   HGB 15.4 03/29/2019 0956   HCT 44.9 03/29/2019 0956   PLT 233 03/29/2019 0956   MCV 92 03/29/2019 0956   MCH 31.5 03/29/2019 0956   MCHC 34.3 03/29/2019 0956   RDW 13.9 03/29/2019 0956   LYMPHSABS 1.9 03/29/2019 0956   EOSABS 0.1 03/29/2019 0956   BASOSABS 0.0  03/29/2019 0956   Iron/TIBC/Ferritin/ %Sat No results found for: IRON, TIBC, FERRITIN, IRONPCTSAT Lipid Panel     Component Value Date/Time   CHOL 208 (H) 03/29/2019 0956   TRIG 159 (H) 03/29/2019 6045  HDL 56 03/29/2019 0956   LDLCALC 124 (H) 03/29/2019 0956   Hepatic Function Panel     Component Value Date/Time   PROT 6.5 03/29/2019 0956   ALBUMIN 4.2 03/29/2019 0956   AST 67 (H) 03/29/2019 0956   ALT 99 (H) 03/29/2019 0956   ALKPHOS 72 03/29/2019 0956   BILITOT 0.4 03/29/2019 0956      Component Value Date/Time   TSH 1.800 03/29/2019 0956      OBESITY BEHAVIORAL INTERVENTION VISIT  Today's visit was # 2   Starting weight: 265 lbs Starting date: 03/29/2019 Today's weight : 264 lbs Today's date: 04/12/2019 Total lbs lost to date: 1    ASK: We discussed the diagnosis of obesity with Katheran JamesKatelin E Loomer today and Freddi agreed to give us permission to discuss obesity behavioral modification therapy today.  ASSESS: [name] has the diagnosis of obesity and her BMI today is 45.29 Avie is in the action stage of change   ADVISE: [name] was educated on the multiple health risks of obesity as well as the benefit of weight loss to improve her health. She was advised of the need for long term treatment and the importance of lifestyle modifications to improve her current health and to decrease her risk of future health problems.  AGREE: Multiple dietary modification options and treatment options were discussed and  Ariyona agreed to follow the recommendations documented in the above note.  ARRANGE: [name] was educated on the importance of frequent visits to treat obesity as outlined per CMS and USPSTF guidelines and agreed to schedule her next follow up appointment today.  Trude McburneyI, Sharon Martin, am acting as transcriptionist for Helane RimaErica Annjeanette Sarwar, DO  I have reviewed the above documentation for accuracy and completeness, and I agree with the above. Helane Rima- [place] Janus, DO

## 2019-04-14 ENCOUNTER — Encounter (INDEPENDENT_AMBULATORY_CARE_PROVIDER_SITE_OTHER): Payer: Self-pay | Admitting: Family Medicine

## 2019-04-14 DIAGNOSIS — R0981 Nasal congestion: Secondary | ICD-10-CM | POA: Diagnosis not present

## 2019-04-14 DIAGNOSIS — R05 Cough: Secondary | ICD-10-CM | POA: Diagnosis not present

## 2019-04-15 ENCOUNTER — Other Ambulatory Visit (INDEPENDENT_AMBULATORY_CARE_PROVIDER_SITE_OTHER): Payer: Self-pay

## 2019-04-15 ENCOUNTER — Encounter (INDEPENDENT_AMBULATORY_CARE_PROVIDER_SITE_OTHER): Payer: Self-pay

## 2019-04-15 ENCOUNTER — Telehealth (INDEPENDENT_AMBULATORY_CARE_PROVIDER_SITE_OTHER): Payer: Self-pay

## 2019-04-15 DIAGNOSIS — F3289 Other specified depressive episodes: Secondary | ICD-10-CM

## 2019-04-15 DIAGNOSIS — G43809 Other migraine, not intractable, without status migrainosus: Secondary | ICD-10-CM

## 2019-04-15 MED ORDER — TOPIRAMATE 50 MG PO TABS: 50.0000 mg | ORAL_TABLET | Freq: Two times a day (BID) | ORAL | 0 refills | Status: DC

## 2019-04-15 MED ORDER — SERTRALINE HCL 100 MG PO TABS
100.0000 mg | ORAL_TABLET | Freq: Every day | ORAL | 0 refills | Status: DC
Start: 2019-04-15 — End: 2019-08-09

## 2019-04-15 NOTE — Telephone Encounter (Signed)
I spoke with the pt that requested therapy information to be sent to her. She also asked for medicine for headaches that had been discussed with Dr. Juleen China at her visit. Dr Juleen China prescribed Topamax 50 mg po BID #60 which was sent to the pharmacy. The mental health providers list was sent via email to the pt. Moshe Salisbury, CMA

## 2019-04-15 NOTE — Telephone Encounter (Signed)
Please advise 

## 2019-04-16 ENCOUNTER — Encounter (INDEPENDENT_AMBULATORY_CARE_PROVIDER_SITE_OTHER): Payer: Self-pay | Admitting: Family Medicine

## 2019-04-28 ENCOUNTER — Ambulatory Visit (INDEPENDENT_AMBULATORY_CARE_PROVIDER_SITE_OTHER): Payer: BC Managed Care – PPO | Admitting: Family Medicine

## 2019-04-28 ENCOUNTER — Other Ambulatory Visit: Payer: Self-pay

## 2019-04-28 ENCOUNTER — Encounter (INDEPENDENT_AMBULATORY_CARE_PROVIDER_SITE_OTHER): Payer: Self-pay | Admitting: Family Medicine

## 2019-04-28 VITALS — BP 120/77 | HR 74 | Temp 98.2°F | Ht 64.0 in | Wt 251.0 lb

## 2019-04-28 DIAGNOSIS — G43809 Other migraine, not intractable, without status migrainosus: Secondary | ICD-10-CM

## 2019-04-28 DIAGNOSIS — Z6841 Body Mass Index (BMI) 40.0 and over, adult: Secondary | ICD-10-CM

## 2019-04-28 DIAGNOSIS — R7989 Other specified abnormal findings of blood chemistry: Secondary | ICD-10-CM | POA: Diagnosis not present

## 2019-04-28 DIAGNOSIS — F4323 Adjustment disorder with mixed anxiety and depressed mood: Secondary | ICD-10-CM

## 2019-04-28 DIAGNOSIS — E559 Vitamin D deficiency, unspecified: Secondary | ICD-10-CM | POA: Diagnosis not present

## 2019-04-28 DIAGNOSIS — E7849 Other hyperlipidemia: Secondary | ICD-10-CM | POA: Diagnosis not present

## 2019-04-28 DIAGNOSIS — E8881 Metabolic syndrome: Secondary | ICD-10-CM | POA: Diagnosis not present

## 2019-04-28 DIAGNOSIS — Z9189 Other specified personal risk factors, not elsewhere classified: Secondary | ICD-10-CM

## 2019-04-28 MED ORDER — TOPIRAMATE 50 MG PO TABS: 50.0000 mg | ORAL_TABLET | Freq: Two times a day (BID) | ORAL | 0 refills | Status: DC

## 2019-04-28 MED ORDER — VITAMIN D (ERGOCALCIFEROL) 1.25 MG (50000 UNIT) PO CAPS: 50000.0000 [IU] | ORAL_CAPSULE | ORAL | 0 refills | Status: DC

## 2019-04-28 NOTE — Progress Notes (Signed)
Office: [phone]  /  Fax: [phone]   HPI:  Chief Complaint: OBESITY Karla Villa is here to discuss her progress with her obesity treatment plan. She is on the Category 1 plan and states she is following her eating plan approximately 20 % of the time. She states she is walking 14,000 to 20,000 steps a day.  Karla Villa live-in boyfriend broke up with her two days after our last visit. She is now living with her mom. Karla Villa is eating one meal a day due to the severe stress.  Vitamin D deficiency Karla Villa has a diagnosis of vitamin D deficiency. Karla Villa is currently taking vit D and she denies nausea, vomiting or muscle weakness.  Insulin Resistance Karla Villa has a diagnosis of insulin resistance and she is not on medications. Her last insulin level was at 26.0 (03/29/19).    At risk for diabetes Karla Villa is at higher than average risk for developing diabetes due to her obesity and insulin resistance.   Elevated LFTs Karla Villa has a diagnosis of elevated ALT. Her last ALT was 99 (03/29/19).  Hyperlipidemia Karla Villa has hyperlipidemia and she is not on medications. Her last LDL was 124, triglycerides were at 159 and HDL was 56 (03/29/19).  Migraines Karla Villa has a diagnosis of migraines. Her migraines are improved with Topamax.  Situational Anxiety and Depression Karla Villa has a diagnosis of situational anxiety and depression, and she feels lonely.   Today's visit was # 3  Starting weight: 265 lbs Starting date: 03/29/2019 Today's weight : 251 lbs Today's date: 04/28/2019 Total lbs lost to date: 14 Total lbs lost since last in-office visit: 13  ASSESSMENT AND PLAN:  Vitamin D deficiency - Plan: Vitamin D, Ergocalciferol, (DRISDOL) 1.25 MG (50000 UT) CAPS capsule  Insulin resistance - Plan: Comprehensive Metabolic Panel (CMET)  Other hyperlipidemia  Elevated LFTs  Other migraine without status migrainosus, not intractable - Plan: topiramate (TOPAMAX) 50 MG  tablet  Situational mixed anxiety and depressive disorder  At risk for diabetes mellitus  Class 3 severe obesity with serious comorbidity and body mass index (BMI) of 40.0 to 44.9 in adult, unspecified obesity type ([place])  PLAN:  Vitamin D Deficiency Low Vitamin D level contributes to fatigue and are associated with obesity, breast, and colon cancer. Karla Villa agrees to continue to take prescription Vitamin D @50 ,000 IU every week #4 with no refills and she will follow up for routine testing of vitamin D, at least 2-3 times per year to avoid over-replacement. Karla Villa agrees to follow up with our clinic in 3 weeks.  Insulin Resistance Karla Villa will continue to work on weight loss, exercise, and decreasing simple carbohydrates to help decrease the risk of diabetes. We will monitor and Karla Villa agreed to follow-up with [place] as directed to closely monitor her progress.  Diabetes risk counseling (~15 min) Karla Villa is a 33 y.o. female and has risk factors for diabetes including obesity and insulin resistance. We discussed intensive lifestyle modifications today with an emphasis on weight loss as well as increasing exercise and decreasing simple carbohydrates in her diet.  Elevated LFTs We discussed the likely diagnosis of non-alcoholic fatty liver disease today and how this condition is obesity related. [name] was educated the importance of weight loss. Karla Villa agreed to continue with her weight loss efforts with healthier diet and exercise as an essential part of her treatment plan. We will recheck labs today and Karla Villa agrees to follow up with our clinic in 3 weeks.  Hyperlipidemia Intensive lifestyle modifications as the first line treatment for  hyperlipidemia. We discussed many lifestyle modifications today and Karla Villa will continue to work on diet, exercise and weight loss efforts. We will monitor and Karla Villa agrees to follow up at the agreed upon time.  Migraines Karla Villa agrees to continue  Topamax 50 mg two times daily #60 with no refills and follow up as directed.  Situational Anxiety and Depression We will refer patient to Karla Villa. We also gave information for Karla Villa. Karla Villa agrees to follow up with our clinic in 3 weeks.  Obesity Karla Villa is currently in the action stage of change. As such, her goal is to continue with weight loss efforts She has agreed to follow the Category 1 plan. Karla Villa has been instructed to work up to a goal of 150 minutes of combined cardio and strengthening exercise per week for weight loss and overall health benefits. We discussed the following Behavioral Modification Strategies today: increase H2O intake, work on meal planning and easy cooking plans and emotional eating strategies.  Karla Villa has agreed to follow-up with our clinic in 3 weeks. She was informed of the importance of frequent follow-up visits to maximize her success with intensive lifestyle modifications for her multiple health conditions.  ALLERGIES: Allergies  Allergen Reactions  . Doxycycline Nausea And Vomiting  . Lamisil [Terbinafine Hcl] Other (See Comments)    Headaches    MEDICATIONS: Current Outpatient Medications on File Prior to Visit  Medication Sig Dispense Refill  . Norethin Ace-Eth Estrad-FE (TAYTULLA) 1-20 MG-MCG(24) CAPS Take by mouth.    Marland Kitchen. albuterol (PROVENTIL HFA;VENTOLIN HFA) 108 (90 BASE) MCG/ACT inhaler Inhale into the lungs every 6 (six) hours as needed for wheezing or shortness of breath.    . ALPRAZolam (XANAX) 0.25 MG tablet Take 0.25 mg by mouth at bedtime as needed for anxiety.    Marland Kitchen. ibuprofen (ADVIL) 200 MG tablet Take 200 mg by mouth every 6 (six) hours as needed.    . loratadine (CLARITIN) 10 MG tablet Take 10 mg by mouth daily.    . sertraline (ZOLOFT) 100 MG tablet Take 1 tablet (100 mg total) by mouth at bedtime. 30 tablet 0  . valACYclovir (VALTREX) 1000 MG tablet Take 1,000 mg by mouth 2 (two) times daily. PRN     No current  facility-administered medications on file prior to visit.    PAST MEDICAL HISTORY: Past Medical History:  Diagnosis Date  . Anxiety   . Asthma   . Back pain   . Bilateral swelling of feet   . Chest pain   . Constipation   . Depression   . Eczema   . Heartburn   . Hypertension   . Indigestion   . Migraines   . Palpitations   . Panic attacks   . PCOS (polycystic ovarian syndrome)   . Shortness of breath   . Vertigo     PAST SURGICAL HISTORY: Past Surgical History:  Procedure Laterality Date  . TONSILLECTOMY    . WISDOM TOOTH EXTRACTION      SOCIAL HISTORY: Social History   Tobacco Use  . Smoking status: Never Smoker  . Smokeless tobacco: Never Used  Substance Use Topics  . Alcohol use: Yes    Comment: occasionally  . Drug use: Not on file    FAMILY HISTORY: History reviewed. No pertinent family history.  ROS: Review of Systems  Constitutional: Positive for weight loss.  Gastrointestinal: Negative for nausea and vomiting.  Musculoskeletal:       Negative for muscle weakness  Psychiatric/Behavioral: Positive for depression. The  patient is nervous/anxious.     PHYSICAL EXAM: Blood pressure 120/77, pulse 74, temperature 98.2 F (36.8 C), temperature source Oral, height 5\' 4"  (1.626 m), weight 251 lb (113.9 kg), SpO2 97 %. Body mass index is 43.08 kg/m.   General: Cooperative, alert, well developed, in no acute distress. HEENT: Conjunctivae and lids unremarkable. Neck: No thyromegaly.  Cardiovascular: Regular rhythm.  Lungs: Normal work of breathing. Extremities: No edema.  Neurologic: No focal deficits.   RECENT LABS AND TESTS: BMET    Component Value Date/Time   NA 138 03/29/2019 0956   K 4.1 03/29/2019 0956   CL 101 03/29/2019 0956   CO2 20 03/29/2019 0956   GLUCOSE 88 03/29/2019 0956   BUN 9 03/29/2019 0956   CREATININE 0.76 03/29/2019 0956   CALCIUM 9.0 03/29/2019 0956   GFRNONAA 105 03/29/2019 0956   GFRAA 121 03/29/2019 0956   Lab  Results  Component Value Date   HGBA1C 4.9 03/29/2019   Lab Results  Component Value Date   INSULIN 26.0 (H) 03/29/2019   CBC    Component Value Date/Time   WBC 6.4 03/29/2019 0956   RBC 4.89 03/29/2019 0956   HGB 15.4 03/29/2019 0956   HCT 44.9 03/29/2019 0956   PLT 233 03/29/2019 0956   MCV 92 03/29/2019 0956   MCH 31.5 03/29/2019 0956   MCHC 34.3 03/29/2019 0956   RDW 13.9 03/29/2019 0956   LYMPHSABS 1.9 03/29/2019 0956   EOSABS 0.1 03/29/2019 0956   BASOSABS 0.0 03/29/2019 0956   Iron/TIBC/Ferritin/ %Sat No results found for: IRON, TIBC, FERRITIN, IRONPCTSAT Lipid Panel     Component Value Date/Time   CHOL 208 (H) 03/29/2019 0956   TRIG 159 (H) 03/29/2019 0956   HDL 56 03/29/2019 0956   LDLCALC 124 (H) 03/29/2019 0956   Hepatic Function Panel     Component Value Date/Time   PROT 6.5 03/29/2019 0956   ALBUMIN 4.2 03/29/2019 0956   AST 67 (H) 03/29/2019 0956   ALT 99 (H) 03/29/2019 0956   ALKPHOS 72 03/29/2019 0956   BILITOT 0.4 03/29/2019 0956      Component Value Date/Time   TSH 1.800 03/29/2019 0956     Ref. Range 03/29/2019 09:56  Vitamin D, 25-Hydroxy Latest Ref Range: 30.0 - 100.0 ng/mL 20.9 (L)    I, 03/31/2019, am acting as transcriptionist for [place] Crane, DO  I have reviewed the above documentation for accuracy and completeness, and I agree with the above. Helane Rima, DO

## 2019-04-29 LAB — COMPREHENSIVE METABOLIC PANEL
ALT: 27 IU/L (ref 0–32)
AST: 16 IU/L (ref 0–40)
Albumin/Globulin Ratio: 2 (ref 1.2–2.2)
Albumin: 4.2 g/dL (ref 3.8–4.8)
Alkaline Phosphatase: 60 IU/L (ref 39–117)
BUN/Creatinine Ratio: 12 (ref 9–23)
BUN: 9 mg/dL (ref 6–20)
Bilirubin Total: 0.5 mg/dL (ref 0.0–1.2)
CO2: 20 mmol/L (ref 20–29)
Calcium: 9.3 mg/dL (ref 8.7–10.2)
Chloride: 107 mmol/L — ABNORMAL HIGH (ref 96–106)
Creatinine, Ser: 0.75 mg/dL (ref 0.57–1.00)
GFR calc Af Amer: 123 mL/min/{1.73_m2} (ref >59)
GFR calc non Af Amer: 107 mL/min/{1.73_m2} (ref >59)
Globulin, Total: 2.1 g/dL (ref 1.5–4.5)
Glucose: 100 mg/dL — ABNORMAL HIGH (ref 65–99)
Potassium: 4.1 mmol/L (ref 3.5–5.2)
Sodium: 141 mmol/L (ref 134–144)
Total Protein: 6.3 g/dL (ref 6.0–8.5)

## 2019-05-04 ENCOUNTER — Encounter (INDEPENDENT_AMBULATORY_CARE_PROVIDER_SITE_OTHER): Payer: Self-pay | Admitting: Family Medicine

## 2019-05-11 ENCOUNTER — Other Ambulatory Visit (INDEPENDENT_AMBULATORY_CARE_PROVIDER_SITE_OTHER): Payer: Self-pay | Admitting: Family Medicine

## 2019-05-11 DIAGNOSIS — F3289 Other specified depressive episodes: Secondary | ICD-10-CM
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
## 2019-05-21 ENCOUNTER — Encounter (INDEPENDENT_AMBULATORY_CARE_PROVIDER_SITE_OTHER): Payer: Self-pay | Admitting: Family Medicine

## 2019-05-21 DIAGNOSIS — Z3200 Encounter for pregnancy test, result unknown: Secondary | ICD-10-CM | POA: Diagnosis not present

## 2019-05-22 DIAGNOSIS — Z20828 Contact with and (suspected) exposure to other viral communicable diseases: Secondary | ICD-10-CM | POA: Diagnosis not present

## 2019-05-23 ENCOUNTER — Encounter (INDEPENDENT_AMBULATORY_CARE_PROVIDER_SITE_OTHER): Payer: Self-pay | Admitting: Family Medicine

## 2019-05-23 ENCOUNTER — Other Ambulatory Visit (INDEPENDENT_AMBULATORY_CARE_PROVIDER_SITE_OTHER): Payer: Self-pay | Admitting: Family Medicine

## 2019-05-23 DIAGNOSIS — F3289 Other specified depressive episodes: Secondary | ICD-10-CM

## 2019-05-24 ENCOUNTER — Other Ambulatory Visit: Payer: BC Managed Care – PPO

## 2019-05-24 ENCOUNTER — Ambulatory Visit (INDEPENDENT_AMBULATORY_CARE_PROVIDER_SITE_OTHER): Payer: BC Managed Care – PPO | Admitting: Psychology

## 2019-05-24 ENCOUNTER — Ambulatory Visit (INDEPENDENT_AMBULATORY_CARE_PROVIDER_SITE_OTHER): Payer: BC Managed Care – PPO | Admitting: Family Medicine

## 2019-05-24 DIAGNOSIS — U071 COVID-19: Secondary | ICD-10-CM | POA: Diagnosis not present

## 2019-05-24 DIAGNOSIS — J45990 Exercise induced bronchospasm: Secondary | ICD-10-CM | POA: Diagnosis not present

## 2019-05-24 DIAGNOSIS — I10 Essential (primary) hypertension: Secondary | ICD-10-CM | POA: Diagnosis not present

## 2019-05-24 DIAGNOSIS — R05 Cough: Secondary | ICD-10-CM | POA: Diagnosis not present

## 2019-05-31 ENCOUNTER — Telehealth: Payer: Self-pay | Admitting: Nurse Practitioner

## 2019-05-31 ENCOUNTER — Other Ambulatory Visit: Payer: Self-pay

## 2019-05-31 ENCOUNTER — Emergency Department (HOSPITAL_COMMUNITY)
Admission: EM | Admit: 2019-05-31 | Discharge: 2019-05-31 | Disposition: A | Discharge status: Home / Self Care | Payer: BC Managed Care – PPO | Attending: Emergency Medicine | Admitting: Emergency Medicine

## 2019-05-31 ENCOUNTER — Emergency Department (HOSPITAL_COMMUNITY): Payer: BC Managed Care – PPO

## 2019-05-31 ENCOUNTER — Encounter (HOSPITAL_COMMUNITY): Payer: Self-pay | Admitting: Emergency Medicine

## 2019-05-31 DIAGNOSIS — I10 Essential (primary) hypertension: Secondary | ICD-10-CM | POA: Diagnosis not present

## 2019-05-31 DIAGNOSIS — Z79899 Other long term (current) drug therapy: Secondary | ICD-10-CM | POA: Insufficient documentation

## 2019-05-31 DIAGNOSIS — R0602 Shortness of breath: Secondary | ICD-10-CM | POA: Diagnosis not present

## 2019-05-31 DIAGNOSIS — J45909 Unspecified asthma, uncomplicated: Secondary | ICD-10-CM | POA: Diagnosis not present

## 2019-05-31 DIAGNOSIS — R05 Cough: Secondary | ICD-10-CM | POA: Diagnosis not present

## 2019-05-31 DIAGNOSIS — U071 COVID-19: Secondary | ICD-10-CM

## 2019-05-31 LAB — CBC WITH DIFFERENTIAL/PLATELET
Abs Immature Granulocytes: 0.12 K/uL — ABNORMAL HIGH (ref 0.00–0.07)
Basophils Absolute: 0 K/uL (ref 0.0–0.1)
Basophils Relative: 1 %
Eosinophils Absolute: 0.1 K/uL (ref 0.0–0.5)
Eosinophils Relative: 1 %
HCT: 47.6 % — ABNORMAL HIGH (ref 36.0–46.0)
Hemoglobin: 16.2 g/dL — ABNORMAL HIGH (ref 12.0–15.0)
Immature Granulocytes: 2 %
Lymphocytes Relative: 25 %
Lymphs Abs: 1.7 K/uL (ref 0.7–4.0)
MCH: 31.5 pg (ref 26.0–34.0)
MCHC: 34 g/dL (ref 30.0–36.0)
MCV: 92.6 fL (ref 80.0–100.0)
Monocytes Absolute: 0.5 K/uL (ref 0.1–1.0)
Monocytes Relative: 7 %
Neutro Abs: 4.3 K/uL (ref 1.7–7.7)
Neutrophils Relative %: 64 %
Platelets: 182 K/uL (ref 150–400)
RBC: 5.14 MIL/uL — ABNORMAL HIGH (ref 3.87–5.11)
RDW: 13.5 % (ref 11.5–15.5)
WBC: 6.7 K/uL (ref 4.0–10.5)
nRBC: 0 % (ref 0.0–0.2)

## 2019-05-31 LAB — COMPREHENSIVE METABOLIC PANEL WITH GFR
ALT: 30 U/L (ref 0–44)
AST: 25 U/L (ref 15–41)
Albumin: 3.3 g/dL — ABNORMAL LOW (ref 3.5–5.0)
Alkaline Phosphatase: 62 U/L (ref 38–126)
Anion gap: 10 (ref 5–15)
BUN: 11 mg/dL (ref 6–20)
CO2: 22 mmol/L (ref 22–32)
Calcium: 8.4 mg/dL — ABNORMAL LOW (ref 8.9–10.3)
Chloride: 106 mmol/L (ref 98–111)
Creatinine, Ser: 0.82 mg/dL (ref 0.44–1.00)
GFR calc Af Amer: >60 mL/min
GFR calc non Af Amer: >60 mL/min
Glucose, Bld: 90 mg/dL (ref 70–99)
Potassium: 3.4 mmol/L — ABNORMAL LOW (ref 3.5–5.1)
Sodium: 138 mmol/L (ref 135–145)
Total Bilirubin: 0.6 mg/dL (ref 0.3–1.2)
Total Protein: 6.2 g/dL — ABNORMAL LOW (ref 6.5–8.1)

## 2019-05-31 LAB — D-DIMER, QUANTITATIVE: D-Dimer, Quant: 0.42 ug/mL-FEU (ref 0.00–0.50)

## 2019-05-31 MED ORDER — DEXAMETHASONE SODIUM PHOSPHATE 10 MG/ML IJ SOLN
10.0000 mg | Freq: Once | INTRAMUSCULAR | Status: AC
  Administered 2019-05-31: 10 mg via INTRAVENOUS
  Filled 2019-05-31: qty 1

## 2019-05-31 MED ORDER — SODIUM CHLORIDE 0.9 % IV BOLUS
1000.0000 mL | Freq: Once | INTRAVENOUS | Status: AC
  Administered 2019-05-31: 11:00:00 1000 mL via INTRAVENOUS

## 2019-05-31 NOTE — ED Provider Notes (Signed)
[facility]-EMERGENCY DEPT Provider Note   CSN: [id number] Arrival date & time: 05/31/19  1007     History Chief Complaint  Patient presents with  . Covid +  . Shortness of Breath  . Cough    Karla Villa is a 34 y.o. female.  Patient complains of shortness of breath and cough.  She was diagnosed with COVID-19, 8 days ago.  She was placed on a Medrol Dosepak.    The history is provided by the patient. No language interpreter was used.  Shortness of Breath Severity:  Moderate Onset quality:  Sudden Timing:  Constant Progression:  Worsening Chronicity:  New Context: activity   Relieved by:  Nothing Worsened by:  Nothing Associated symptoms: cough   Associated symptoms: no abdominal pain, no chest pain, no headaches and no rash   Cough Associated symptoms: shortness of breath   Associated symptoms: no chest pain, no eye discharge, no headaches and no rash        Past Medical History:  Diagnosis Date  . Anxiety   . Asthma   . Back pain   . Bilateral swelling of feet   . Chest pain   . Constipation   . Depression   . Eczema   . Heartburn   . Hypertension   . Indigestion   . Migraines   . Palpitations   . Panic attacks   . PCOS (polycystic ovarian syndrome)   . Shortness of breath   . Vertigo     There are no problems to display for this patient.   Past Surgical History:  Procedure Laterality Date  . TONSILLECTOMY    . WISDOM TOOTH EXTRACTION       OB History    Gravida  0   Para  0   Term  0   Preterm  0   AB  0   Living  0     SAB  0   TAB  0   Ectopic  0   Multiple  0   Live Births  0           No family history on file.  Social History   Tobacco Use  . Smoking status: Never Smoker  . Smokeless tobacco: Never Used  Substance Use Topics  . Alcohol use: Yes    Comment: occasionally  . Drug use: Not on file    Home Medications Prior to Admission medications   Medication Sig Start Date  End Date Taking? Authorizing Provider  acetaminophen (TYLENOL) 325 MG tablet Take 650 mg by mouth every 6 (six) hours as needed for mild pain or headache.   Yes [provider]  acidophilus (RISAQUAD) CAPS capsule Take 1 capsule by mouth daily.   Yes [provider]  albuterol (PROVENTIL HFA;VENTOLIN HFA) 108 (90 BASE) MCG/ACT inhaler Inhale into the lungs every 6 (six) hours as needed for wheezing or shortness of breath.   Yes [provider]  ALPRAZolam Prudy Feeler) 0.5 MG tablet Take 0.5 mg by mouth daily as needed for anxiety. 04/20/19  Yes [provider]  Ascorbic Acid (VITAMIN C) 1000 MG tablet Take 1,000 mg by mouth daily.   Yes [provider]  benzonatate (TESSALON) 100 MG capsule Take 100 mg by mouth 3 (three) times daily as needed for cough.   Yes [provider]  diphenhydrAMINE (BENADRYL) 25 mg capsule Take 25 mg by mouth at bedtime as needed for sleep.   Yes [provider]  diphenhydrAMINE-PE-APAP (DELSYM CGH/CLD NIGHTTIME CHILD) 12.5-5-325 MG/10ML LIQD Take 10 mLs by mouth every 12 (twelve) hours as needed (congestion).   Yes [provider]  ibuprofen (ADVIL) 200 MG tablet Take 800 mg by mouth every 6 (six) hours as needed for fever, headache or moderate pain.    Yes [provider]  Multiple Vitamins-Minerals (ZINC PO) Take 1 tablet by mouth daily.   Yes [provider]  sodium chloride (OCEAN) 0.65 % SOLN nasal spray Place 1 spray into both nostrils as needed for congestion.   Yes [provider]  topiramate (TOPAMAX) 50 MG tablet Take 1 tablet (50 mg total) by mouth 2 (two) times daily. 04/28/19  Yes Briscoe Deutscher, DO  Vitamin D, Ergocalciferol, (DRISDOL) 1.25 MG (50000 UT) CAPS capsule Take 1 capsule (50,000 Units total) by mouth every 7 (seven) days. 04/28/19  Yes Briscoe Deutscher, DO  predniSONE (STERAPRED UNI-PAK 21 TAB) 10 MG (21) TBPK tablet See admin instructions. 6 day dosepack  05/24/19   [provider]  sertraline (ZOLOFT) 100 MG tablet Take 1 tablet (100 mg total) by mouth at bedtime. Patient not taking: Reported on 05/31/2019 04/15/19   Briscoe Deutscher, DO    Allergies    Doxycycline and Lamisil [terbinafine hcl]  Review of Systems   Review of Systems  Constitutional: Negative for appetite change and fatigue.  HENT: Negative for congestion, ear discharge and sinus pressure.   Eyes: Negative for discharge.  Respiratory: Positive for cough and shortness of breath.   Cardiovascular: Negative for chest pain.  Gastrointestinal: Negative for abdominal pain and diarrhea.  Genitourinary: Negative for frequency and hematuria.  Musculoskeletal: Negative for back pain.  Skin: Negative for rash.  Neurological: Negative for seizures and headaches.  Psychiatric/Behavioral: Negative for hallucinations.    Physical Exam Updated Vital Signs BP 132/74 (BP Location: Right Arm)   Pulse (!) 102   Temp 99.4 F (37.4 C) (Oral)   Resp (!) 23   SpO2 97%   Physical Exam Vitals and nursing note reviewed.  Constitutional:      Appearance: She is well-developed.  HENT:     Head: Normocephalic.     Nose: Nose normal.  Eyes:     General: No scleral icterus.    Conjunctiva/sclera: Conjunctivae normal.  Neck:     Thyroid: No thyromegaly.  Cardiovascular:     Rate and Rhythm: Normal rate and regular rhythm.     Heart sounds: No murmur. No friction rub. No gallop.   Pulmonary:     Breath sounds: No stridor. Wheezing present. No rales.  Chest:     Chest wall: No tenderness.  Abdominal:     General: There is no distension.     Tenderness: There is no abdominal tenderness. There is no rebound.  Musculoskeletal:        General: Normal range of motion.     Cervical back: Neck supple.  Lymphadenopathy:     Cervical: No cervical adenopathy.  Skin:    Findings: No erythema or rash.  Neurological:     Mental Status: She is alert and oriented to person, place,  and time.     Motor: No abnormal muscle tone.     Coordination: Coordination normal.  Psychiatric:        Behavior: Behavior normal.     ED Results / Procedures / Treatments   Labs (all labs ordered are listed, but only abnormal results are displayed) Labs Reviewed  CBC WITH DIFFERENTIAL/PLATELET - Abnormal; Notable for the following  components:      Result Value   RBC 5.14 (*)    Hemoglobin 16.2 (*)    HCT 47.6 (*)    Abs Immature Granulocytes 0.12 (*)    All other components within normal limits  COMPREHENSIVE METABOLIC PANEL - Abnormal; Notable for the following components:   Potassium 3.4 (*)    Calcium 8.4 (*)    Total Protein 6.2 (*)    Albumin 3.3 (*)    All other components within normal limits  D-DIMER, QUANTITATIVE (NOT AT [facility])    EKG None  Radiology DG Chest Port 1 View  Result Date: 05/31/2019 CLINICAL DATA:  Cough, shortness of breath. EXAM: PORTABLE CHEST 1 VIEW COMPARISON:  06/25/2018. FINDINGS: Trachea is midline. Heart size normal. Added density in the right infrahilar region is felt to be due to overlying soft tissue. Lungs are clear. No pleural fluid. IMPRESSION: No acute findings. Electronically Signed   By: Leanna Battles M.D.   On: 05/31/2019 11:48    Procedures Procedures (including critical care time)  Medications Ordered in ED Medications  sodium chloride 0.9 % bolus 1,000 mL (1,000 mLs Intravenous New Bag/Given 05/31/19 1123)  dexamethasone (DECADRON) injection 10 mg (10 mg Intravenous Given 05/31/19 1124)    ED Course  I have reviewed the triage vital signs and the nursing notes.  Pertinent labs & imaging results that were available during my care of the patient were reviewed by me and considered in my medical decision making (see chart for details).    MDM Rules/Calculators/A&P                      Patient nontoxic.  Patient will be instructed to continue her albuterol.  I have given her information to the infusion clinic at [facility] for possible antibody treatment.  She will return if any problem Final Clinical Impression(s) / ED Diagnoses Final diagnoses:  COVID-19    Rx / DC Orders ED Discharge Orders    None       Bethann Berkshire, MD 05/31/19 1459

## 2019-05-31 NOTE — Discharge Instructions (Addendum)
Continue using your albuterol 2 puffs every 4 hours.  Take Tylenol or Motrin for pain drink plenty of fluids.  I have called the [facility] infusion clinic.  The number is [phone].  I have given them your number and they may call you back in the next couple days to possibly set up you getting IV antibodies.  Return if any problems.

## 2019-05-31 NOTE — ED Triage Notes (Signed)
Patient here from home reporting cough and sob x8 days. Covid+

## 2019-05-31 NOTE — Telephone Encounter (Signed)
Called to Discuss with patient about Covid symptoms and the use of bamlanivimab, a monoclonal antibody infusion for those with mild to moderate Covid symptoms and at a high risk of hospitalization.     Pt states that symptoms started on 05/23/19 - placed on cancellation list for 06/01/19 for infusion.

## 2019-06-01 ENCOUNTER — Other Ambulatory Visit: Payer: Self-pay | Admitting: Nurse Practitioner

## 2019-06-01 ENCOUNTER — Encounter (INDEPENDENT_AMBULATORY_CARE_PROVIDER_SITE_OTHER): Payer: Self-pay | Admitting: Family Medicine

## 2019-06-01 ENCOUNTER — Ambulatory Visit (HOSPITAL_COMMUNITY)
Admission: RE | Admit: 2019-06-01 | Discharge: 2019-06-01 | Disposition: A | Discharge status: Home / Self Care | Payer: BC Managed Care – PPO | Source: Ambulatory Visit | Attending: Pulmonary Disease | Admitting: Pulmonary Disease

## 2019-06-01 DIAGNOSIS — U071 COVID-19: Secondary | ICD-10-CM | POA: Diagnosis not present

## 2019-06-01 DIAGNOSIS — Z23 Encounter for immunization: Secondary | ICD-10-CM | POA: Diagnosis not present

## 2019-06-01 DIAGNOSIS — Z6841 Body Mass Index (BMI) 40.0 and over, adult: Secondary | ICD-10-CM

## 2019-06-01 MED ORDER — DIPHENHYDRAMINE HCL 50 MG/ML IJ SOLN: 50.0000 mg | Freq: Once | INTRAMUSCULAR | Status: DC | PRN

## 2019-06-01 MED ORDER — METHYLPREDNISOLONE SODIUM SUCC 125 MG IJ SOLR: 125.0000 mg | Freq: Once | INTRAMUSCULAR | Status: DC | PRN

## 2019-06-01 MED ORDER — FAMOTIDINE IN NACL 20-0.9 MG/50ML-% IV SOLN: 20.0000 mg | Freq: Once | INTRAVENOUS | Status: DC | PRN

## 2019-06-01 MED ORDER — SODIUM CHLORIDE 0.9 % IV SOLN
INTRAVENOUS | Status: DC | PRN
  Administered 2019-06-01: 250 mL via INTRAVENOUS

## 2019-06-01 MED ORDER — ALBUTEROL SULFATE HFA 108 (90 BASE) MCG/ACT IN AERS: 2.0000 | INHALATION_SPRAY | Freq: Once | RESPIRATORY_TRACT | Status: DC | PRN

## 2019-06-01 MED ORDER — SODIUM CHLORIDE 0.9 % IV SOLN
700.0000 mg | Freq: Once | INTRAVENOUS | Status: AC
  Administered 2019-06-01: 700 mg via INTRAVENOUS
  Filled 2019-06-01: qty 20

## 2019-06-01 MED ORDER — EPINEPHRINE 0.3 MG/0.3ML IJ SOAJ: 0.3000 mg | Freq: Once | INTRAMUSCULAR | Status: DC | PRN

## 2019-06-01 NOTE — Discharge Instructions (Signed)

## 2019-06-01 NOTE — Progress Notes (Signed)
  I connected by phone with Karla Villa on 06/01/2019 at 8:28 AM to discuss the potential use of an new treatment for mild to moderate COVID-19 viral infection in non-hospitalized patients.  This patient is a 34 y.o. female that meets the FDA criteria for Emergency Use Authorization of bamlanivimab or casirivimab\imdevimab.  Has a (+) direct SARS-CoV-2 viral test result  Has mild or moderate COVID-19   Is ? 34 years of age and weighs ? 40 kg  Is NOT hospitalized due to COVID-19  Is NOT requiring oxygen therapy or requiring an increase in baseline oxygen flow rate due to COVID-19  Is within 10 days of symptom onset  Has at least one of the high risk factor(s) for progression to severe COVID-19 and/or hospitalization as defined in EUA.  Specific high risk criteria : BMI >/= 35   I have spoken and communicated the following to the patient or parent/caregiver:  1. FDA has authorized the emergency use of bamlanivimab and casirivimab\imdevimab for the treatment of mild to moderate COVID-19 in adults and pediatric patients with positive results of direct SARS-CoV-2 viral testing who are 5 years of age and older weighing at least 40 kg, and who are at high risk for progressing to severe COVID-19 and/or hospitalization.  2. The significant known and potential risks and benefits of bamlanivimab and casirivimab\imdevimab, and the extent to which such potential risks and benefits are unknown.  3. Information on available alternative treatments and the risks and benefits of those alternatives, including clinical trials.  4. Patients treated with bamlanivimab and casirivimab\imdevimab should continue to self-isolate and use infection control measures (e.g., wear mask, isolate, social distance, avoid sharing personal items, clean and disinfect "high touch" surfaces, and frequent handwashing) according to CDC guidelines.   5. The patient or parent/caregiver has the option to accept or refuse  bamlanivimab or casirivimab\imdevimab .  After reviewing this information with the patient, The patient agreed to proceed with receiving the bamlanimivab infusion and will be provided a copy of the Fact sheet prior to receiving the infusion.[name] 06/01/2019 8:28 AM

## 2019-06-01 NOTE — Progress Notes (Signed)
  Diagnosis: COVID-19  Physician: Dr Wright  Procedure: Covid Infusion Clinic Med: bamlanivimab infusion - Provided patient with bamlanimivab fact sheet for patients, parents and caregivers prior to infusion.  Complications: No immediate complications noted.  Discharge: Discharged home   Karla Villa 06/01/2019  

## 2019-06-01 NOTE — Telephone Encounter (Signed)
Please review

## 2019-06-15 ENCOUNTER — Other Ambulatory Visit (INDEPENDENT_AMBULATORY_CARE_PROVIDER_SITE_OTHER): Payer: Self-pay | Admitting: Family Medicine

## 2019-06-15 DIAGNOSIS — E559 Vitamin D deficiency, unspecified: Secondary | ICD-10-CM

## 2019-06-15 NOTE — Telephone Encounter (Signed)
Please review

## 2019-06-15 NOTE — Telephone Encounter (Signed)
Should be for wallace

## 2019-06-17 ENCOUNTER — Other Ambulatory Visit (INDEPENDENT_AMBULATORY_CARE_PROVIDER_SITE_OTHER): Payer: Self-pay | Admitting: Family Medicine

## 2019-06-17 DIAGNOSIS — G43809 Other migraine, not intractable, without status migrainosus: Secondary | ICD-10-CM

## 2019-07-07 DIAGNOSIS — Z130 Encounter for screening for diseases of the blood and blood-forming organs and certain disorders involving the immune mechanism: Secondary | ICD-10-CM | POA: Diagnosis not present

## 2019-07-07 DIAGNOSIS — Z1329 Encounter for screening for other suspected endocrine disorder: Secondary | ICD-10-CM | POA: Diagnosis not present

## 2019-07-07 DIAGNOSIS — Z131 Encounter for screening for diabetes mellitus: Secondary | ICD-10-CM | POA: Diagnosis not present

## 2019-07-07 DIAGNOSIS — Z113 Encounter for screening for infections with a predominantly sexual mode of transmission: Secondary | ICD-10-CM | POA: Diagnosis not present

## 2019-07-07 DIAGNOSIS — Z0000 Encounter for general adult medical examination without abnormal findings: Secondary | ICD-10-CM | POA: Diagnosis not present

## 2019-07-07 DIAGNOSIS — Z13220 Encounter for screening for lipoid disorders: Secondary | ICD-10-CM | POA: Diagnosis not present

## 2019-07-07 DIAGNOSIS — Z114 Encounter for screening for human immunodeficiency virus [HIV]: Secondary | ICD-10-CM | POA: Diagnosis not present

## 2019-07-07 DIAGNOSIS — Z1159 Encounter for screening for other viral diseases: Secondary | ICD-10-CM | POA: Diagnosis not present

## 2019-07-14 DIAGNOSIS — N915 Oligomenorrhea, unspecified: Secondary | ICD-10-CM | POA: Diagnosis not present

## 2019-07-14 DIAGNOSIS — Z118 Encounter for screening for other infectious and parasitic diseases: Secondary | ICD-10-CM | POA: Diagnosis not present

## 2019-07-14 DIAGNOSIS — Z1151 Encounter for screening for human papillomavirus (HPV): Secondary | ICD-10-CM | POA: Diagnosis not present

## 2019-07-14 DIAGNOSIS — Z6841 Body Mass Index (BMI) 40.0 and over, adult: Secondary | ICD-10-CM | POA: Diagnosis not present

## 2019-07-14 DIAGNOSIS — Z01419 Encounter for gynecological examination (general) (routine) without abnormal findings: Secondary | ICD-10-CM | POA: Diagnosis not present

## 2019-08-09 ENCOUNTER — Ambulatory Visit (INDEPENDENT_AMBULATORY_CARE_PROVIDER_SITE_OTHER): Payer: BC Managed Care – PPO | Admitting: Family Medicine

## 2019-08-09 ENCOUNTER — Encounter (INDEPENDENT_AMBULATORY_CARE_PROVIDER_SITE_OTHER): Payer: Self-pay | Admitting: Family Medicine

## 2019-08-09 ENCOUNTER — Other Ambulatory Visit: Payer: Self-pay

## 2019-08-09 VITALS — BP 128/85 | HR 71 | Temp 98.1°F | Ht 64.0 in | Wt 246.0 lb

## 2019-08-09 DIAGNOSIS — E559 Vitamin D deficiency, unspecified: Secondary | ICD-10-CM | POA: Diagnosis not present

## 2019-08-09 DIAGNOSIS — Z8616 Personal history of COVID-19: Secondary | ICD-10-CM | POA: Insufficient documentation

## 2019-08-09 DIAGNOSIS — F4323 Adjustment disorder with mixed anxiety and depressed mood: Secondary | ICD-10-CM | POA: Diagnosis not present

## 2019-08-09 DIAGNOSIS — Z6841 Body Mass Index (BMI) 40.0 and over, adult: Secondary | ICD-10-CM

## 2019-08-09 DIAGNOSIS — G4719 Other hypersomnia: Secondary | ICD-10-CM

## 2019-08-09 DIAGNOSIS — Z9189 Other specified personal risk factors, not elsewhere classified: Secondary | ICD-10-CM | POA: Diagnosis not present

## 2019-08-09 DIAGNOSIS — E8881 Metabolic syndrome: Secondary | ICD-10-CM

## 2019-08-09 DIAGNOSIS — E282 Polycystic ovarian syndrome: Secondary | ICD-10-CM

## 2019-08-09 DIAGNOSIS — E88819 Insulin resistance, unspecified: Secondary | ICD-10-CM | POA: Insufficient documentation

## 2019-08-09 MED ORDER — SERTRALINE HCL 100 MG PO TABS: 50.0000 mg | ORAL_TABLET | Freq: Every day | ORAL | 0 refills | Status: DC

## 2019-08-09 NOTE — Progress Notes (Signed)
Chief Complaint:   OBESITY Karla Villa is here to discuss her progress with her obesity treatment plan along with follow-up of her obesity related diagnoses. Karla Villa is on the Category 1 Plan and states she is following her eating plan approximately 0% of the time. Karla Villa states she is exercising for 0 minutes 0 times per week.  Today's visit was #: 4 Starting weight: 265 lbs Starting date: 03/29/2019 Today's weight: 246 lbs Today's date: 08/09/2019 Total lbs lost to date: 19 lbs Total lbs lost since last in-office visit: 5 lbs  Interim History: Karla Villa recently had COVID.  She is still struggling with shortness of breath and wheezing.  She has a history of asthma.  She is not sleeping well.  She says she is very tired in the morning and has morning headaches.  She was out of Zoloft during COVID, and her depression and anxiety worsened.  She is now taking Zoloft 100 mg in the morning.  She says she is coming to terms with her (still recent) breakup with longtime live-in boyfriend.  She has been under a lot of stress at work as her doctor (Dr. Cherly Hensen) is leaving the practice in July.  Subjective:   1. Vitamin D deficiency Karla Villa Vitamin D level was 20.9 on 03/29/2019. She is currently taking prescription vitamin D 50,000 IU each week. She denies nausea, vomiting or muscle weakness.  2. Insulin resistance Karla Villa has a diagnosis of insulin resistance based on her elevated fasting insulin level >5. She continues to work on diet and exercise to decrease her risk of diabetes.  Lab Results  Component Value Date   INSULIN 26.0 (H) 03/29/2019   Lab Results  Component Value Date   HGBA1C 4.9 03/29/2019   3. History of 2019 novel coronavirus disease (COVID-19) Still having shortness of breath, fatigue.  Will have vaccine in 2-3 weeks.  4. Situational mixed anxiety and depressive disorder Now followed by NP at [facility].  Feeling better.  Started Buspar a few weeks ago and feels much  improvement.  5. PCOS (polycystic ovarian syndrome) She is no longer taking an OCP.  No menses since December.    6. Excessive daytime sleepiness Karla Villa has morning headaches and possible apneic events.  Assessment/Plan:   1. Vitamin D deficiency Low Vitamin D level contributes to fatigue and are associated with obesity, breast, and colon cancer. She agrees to continue to take prescription Vitamin D @50 ,000 IU every week and will follow-up for routine testing of Vitamin D, at least 2-3 times per year to avoid over-replacement.  Orders - VITAMIN D 25 Hydroxy (Vit-D Deficiency, Fractures)  2. Insulin resistance Karla Villa will continue to work on weight loss, exercise, and decreasing simple carbohydrates to help decrease the risk of diabetes. Karla Villa agreed to follow-up with as directed to closely monitor her progress. - Insulin, random  3. History of 2019 novel coronavirus disease (COVID-19) Continue inhalers, monitor O2 and shortness of breath on exertion.  4. Situational mixed anxiety and depressive disorder Changed to Zoloft 100 mg at bedtime.  5. PCOS (polycystic ovarian syndrome) Karla Villa has Provera to take to induce menses.  6. Excessive daytime sleepiness Referral has been placed to Sleep Medicine.  Orders - Ambulatory referral to Neurology  7. At risk for deficient intake of food Karla Villa was given approximately 15 minutes of deficit intake of food prevention counseling today. Karla Villa is at risk for eating too few calories based on current food recall. She was encouraged to focus on meeting caloric  and protein goals according to her recommended meal plan.   8. Class 3 severe obesity with serious comorbidity and body mass index (BMI) of 40.0 to 44.9 in adult, unspecified obesity type (HCC) Karla Villa is currently in the action stage of change. As such, her goal is to continue with weight loss efforts. She has agreed to the Category 1 Plan.   Exercise goals: All adults  should avoid inactivity. Some physical activity is better than none, and adults who participate in any amount of physical activity gain some health benefits.  Behavioral modification strategies: increasing water intake and no skipping meals.  Karla Villa has agreed to follow-up with our clinic in 2 weeks. She was informed of the importance of frequent follow-up visits to maximize her success with intensive lifestyle modifications for her multiple health conditions.   Karla Villa was informed we would discuss her lab results at her next visit unless there is a critical issue that needs to be addressed sooner. Karla Villa agreed to keep her next visit at the agreed upon time to discuss these results.  Objective:   Blood pressure 128/85, pulse 71, temperature 98.1 F (36.7 C), temperature source Oral, height 5\' 4"  (1.626 m), weight 246 lb (111.6 kg), last menstrual period 04/14/2019, SpO2 98 %. Body mass index is 42.23 kg/m.  General: Cooperative, alert, well developed, in no acute distress. HEENT: Conjunctivae and lids unremarkable. Cardiovascular: Regular rhythm.  Lungs: Normal work of breathing. Neurologic: No focal deficits.   Lab Results  Component Value Date   CREATININE 0.82 05/31/2019   BUN 11 05/31/2019   NA 138 05/31/2019   K 3.4 (L) 05/31/2019   CL 106 05/31/2019   CO2 22 05/31/2019   Lab Results  Component Value Date   ALT 30 05/31/2019   AST 25 05/31/2019   ALKPHOS 62 05/31/2019   BILITOT 0.6 05/31/2019   Lab Results  Component Value Date   HGBA1C 4.9 03/29/2019   Lab Results  Component Value Date   INSULIN 26.0 (H) 03/29/2019   Lab Results  Component Value Date   TSH 1.800 03/29/2019   Lab Results  Component Value Date   CHOL 208 (H) 03/29/2019   HDL 56 03/29/2019   LDLCALC 124 (H) 03/29/2019   TRIG 159 (H) 03/29/2019   Lab Results  Component Value Date   WBC 6.7 05/31/2019   HGB 16.2 (H) 05/31/2019   HCT 47.6 (H) 05/31/2019   MCV 92.6 05/31/2019   PLT 182  05/31/2019   Attestation Statements:   Reviewed by clinician on day of visit: allergies, medications, problem list, medical history, surgical history, family history, social history, and previous encounter notes.  I, Water quality scientist, CMA, am acting as Location manager for [employer], DO.  I have reviewed the above documentation for accuracy and completeness, and I agree with the above. Briscoe Deutscher, DO

## 2019-08-10 LAB — VITAMIN D 25 HYDROXY (VIT D DEFICIENCY, FRACTURES): Vit D, 25-Hydroxy: 16.5 ng/mL — ABNORMAL LOW (ref 30.0–100.0)

## 2019-08-10 LAB — INSULIN, RANDOM: INSULIN: 29.3 u[IU]/mL — ABNORMAL HIGH (ref 2.6–24.9)

## 2019-08-16 ENCOUNTER — Other Ambulatory Visit (INDEPENDENT_AMBULATORY_CARE_PROVIDER_SITE_OTHER): Payer: Self-pay | Admitting: Family Medicine

## 2019-08-16 DIAGNOSIS — G43809 Other migraine, not intractable, without status migrainosus: Secondary | ICD-10-CM

## 2019-08-17 ENCOUNTER — Encounter (INDEPENDENT_AMBULATORY_CARE_PROVIDER_SITE_OTHER): Payer: Self-pay | Admitting: Family Medicine

## 2019-08-18 NOTE — Telephone Encounter (Signed)
Please advise. Thanks.  

## 2019-09-01 ENCOUNTER — Ambulatory Visit (INDEPENDENT_AMBULATORY_CARE_PROVIDER_SITE_OTHER): Payer: BC Managed Care – PPO | Admitting: Family Medicine

## 2019-09-01 ENCOUNTER — Ambulatory Visit (INDEPENDENT_AMBULATORY_CARE_PROVIDER_SITE_OTHER): Payer: BC Managed Care – PPO | Admitting: Neurology

## 2019-09-01 ENCOUNTER — Encounter (INDEPENDENT_AMBULATORY_CARE_PROVIDER_SITE_OTHER): Payer: Self-pay | Admitting: Family Medicine

## 2019-09-01 ENCOUNTER — Other Ambulatory Visit: Payer: Self-pay

## 2019-09-01 ENCOUNTER — Encounter: Payer: Self-pay | Admitting: Neurology

## 2019-09-01 VITALS — BP 139/93 | HR 82 | Temp 97.6°F | Ht 63.0 in | Wt 243.0 lb

## 2019-09-01 VITALS — BP 127/75 | HR 92 | Temp 98.0°F | Ht 63.0 in | Wt 242.0 lb

## 2019-09-01 DIAGNOSIS — G4719 Other hypersomnia: Secondary | ICD-10-CM

## 2019-09-01 DIAGNOSIS — Z8616 Personal history of COVID-19: Secondary | ICD-10-CM

## 2019-09-01 DIAGNOSIS — G478 Other sleep disorders: Secondary | ICD-10-CM | POA: Diagnosis not present

## 2019-09-01 DIAGNOSIS — E559 Vitamin D deficiency, unspecified: Secondary | ICD-10-CM

## 2019-09-01 DIAGNOSIS — E8881 Metabolic syndrome: Secondary | ICD-10-CM | POA: Diagnosis not present

## 2019-09-01 DIAGNOSIS — Z9189 Other specified personal risk factors, not elsewhere classified: Secondary | ICD-10-CM | POA: Diagnosis not present

## 2019-09-01 DIAGNOSIS — Z6841 Body Mass Index (BMI) 40.0 and over, adult: Secondary | ICD-10-CM

## 2019-09-01 DIAGNOSIS — R0683 Snoring: Secondary | ICD-10-CM

## 2019-09-01 DIAGNOSIS — E66813 Obesity, class 3: Secondary | ICD-10-CM

## 2019-09-01 DIAGNOSIS — E282 Polycystic ovarian syndrome: Secondary | ICD-10-CM

## 2019-09-01 DIAGNOSIS — E88819 Insulin resistance, unspecified: Secondary | ICD-10-CM

## 2019-09-01 DIAGNOSIS — R718 Other abnormality of red blood cells: Secondary | ICD-10-CM

## 2019-09-01 MED ORDER — SAXENDA 18 MG/3ML ~~LOC~~ SOPN: 3.0000 mg | PEN_INJECTOR | Freq: Every day | SUBCUTANEOUS | 0 refills | Status: DC

## 2019-09-01 MED ORDER — BD PEN NEEDLE NANO 2ND GEN 32G X 4 MM MISC: 1.0000 | Freq: Every day | 0 refills | Status: DC

## 2019-09-01 MED ORDER — VITAMIN D (ERGOCALCIFEROL) 1.25 MG (50000 UNIT) PO CAPS: 50000.0000 [IU] | ORAL_CAPSULE | ORAL | 0 refills | Status: DC

## 2019-09-01 NOTE — Patient Instructions (Signed)

## 2019-09-01 NOTE — Progress Notes (Signed)
Chief Complaint:   OBESITY Karla Villa is here to discuss her progress with her obesity treatment plan along with follow-up of her obesity related diagnoses. [name] is on the Category 1 Plan and states she is following her eating plan approximately 100% of the time. [name] states she is walking (2 miles) 2 times per week.  Today's visit was #: 5 Starting weight: 265 lbs Starting date: 03/29/2019 Today's weight: 242 lbs Today's date: 09/01/2019 Total lbs lost to date: 23 lbs Total lbs lost since last in-office visit: 4 lbs  Interim History: Karla Villa is working on increasing her intake of protein and water.  She had a visit with Neurology today and will have a home sleep study.  She endorses polyphagia.  Subjective:   1. Vitamin D deficiency Karla Villa's Vitamin D level was 16.5 on 08/09/2019. She is currently taking prescription vitamin D 50,000 IU every 3 days. She denies nausea, vomiting or muscle weakness.  2. Insulin resistance, PCOS Karla Villa has a diagnosis of insulin resistance based on her elevated fasting insulin level >5. She continues to work on diet and exercise to decrease her risk of diabetes.  She endorses polyphagia.    Lab Results  Component Value Date   INSULIN 29.3 (H) 08/09/2019   INSULIN 26.0 (H) 03/29/2019   Lab Results  Component Value Date   HGBA1C 4.9 03/29/2019   Assessment/Plan:   1. Vitamin D deficiency Low Vitamin D level contributes to fatigue and are associated with obesity, breast, and colon cancer. She agrees to continue to take prescription Vitamin D @50 ,000 IU every week and will follow-up for routine testing of Vitamin D, at least 2-3 times per year to avoid over-replacement.  Orders - Vitamin D, Ergocalciferol, (DRISDOL) 1.25 MG (50000 UNIT) CAPS capsule; Take 1 capsule (50,000 Units total) by mouth every 3 (three) days.  Dispense: 8 capsule; Refill: 0  2. Insulin resistance, PCOS [name] will continue to work on weight loss, exercise, and  decreasing simple carbohydrates to help decrease the risk of diabetes. [name] agreed to follow-up with [place] as directed to closely monitor her progress.  Will start Karla Villa on Saxenda 3 mg subcu daily.  We discussed that dose and how to titrate up slowly.  3. At risk for diabetes mellitus Karla Villa was given approximately 15 minutes of diabetes education and counseling today. We discussed intensive lifestyle modifications today with an emphasis on weight loss as well as increasing exercise and decreasing simple carbohydrates in her diet. We also reviewed medication options with an emphasis on risk versus benefit of those discussed.   Repetitive spaced learning was employed today to elicit superior memory formation and behavioral change.  4. Class 3 severe obesity with serious comorbidity and body mass index (BMI) of 40.0 to 44.9 in adult, unspecified obesity type ([place])  Orders - Liraglutide -Weight Management (SAXENDA) 18 MG/3ML SOPN; Inject 0.5 mLs (3 mg total) into the skin daily.  Dispense: 5 pen; Refill: 0 - Insulin Pen Needle (BD PEN NEEDLE NANO 2ND GEN) 32G X 4 MM MISC; 1 Package by Does not apply route daily.  Dispense: 100 each; Refill: 0  Karla Villa is currently in the action stage of change. As such, her goal is to continue with weight loss efforts. She has agreed to the Category 1 Plan.   Exercise goals: As is.  Behavioral modification strategies: increasing lean protein intake and increasing water intake.  Karla Villa has agreed to follow-up with our clinic in 2 weeks. She was informed of the importance  of frequent follow-up visits to maximize her success with intensive lifestyle modifications for her multiple health conditions.   Objective:   Blood pressure 127/75, pulse 92, temperature 98 F (36.7 C), temperature source Oral, height 5\' 3"  (1.6 m), weight 242 lb (109.8 kg), last menstrual period 08/27/2019, SpO2 95 %. Body mass index is 42.87 kg/m.  General: Cooperative, alert, well  developed, in no acute distress. HEENT: Conjunctivae and lids unremarkable. Cardiovascular: Regular rhythm.  Lungs: Normal work of breathing. Neurologic: No focal deficits.   Lab Results  Component Value Date   CREATININE 0.82 05/31/2019   BUN 11 05/31/2019   NA 138 05/31/2019   K 3.4 (L) 05/31/2019   CL 106 05/31/2019   CO2 22 05/31/2019   Lab Results  Component Value Date   ALT 30 05/31/2019   AST 25 05/31/2019   ALKPHOS 62 05/31/2019   BILITOT 0.6 05/31/2019   Lab Results  Component Value Date   HGBA1C 4.9 03/29/2019   Lab Results  Component Value Date   INSULIN 29.3 (H) 08/09/2019   INSULIN 26.0 (H) 03/29/2019   Lab Results  Component Value Date   TSH 1.800 03/29/2019   Lab Results  Component Value Date   CHOL 208 (H) 03/29/2019   HDL 56 03/29/2019   LDLCALC 124 (H) 03/29/2019   TRIG 159 (H) 03/29/2019   Lab Results  Component Value Date   WBC 6.7 05/31/2019   HGB 16.2 (H) 05/31/2019   HCT 47.6 (H) 05/31/2019   MCV 92.6 05/31/2019   PLT 182 05/31/2019   Attestation Statements:   Reviewed by clinician on day of visit: allergies, medications, problem list, medical history, surgical history, family history, social history, and previous encounter notes.  I, 06/02/2019, CMA, am acting as Insurance claims handler for Energy manager, DO.  I have reviewed the above documentation for accuracy and completeness, and I agree with the above. [employer], DO

## 2019-09-01 NOTE — Progress Notes (Signed)
SLEEP MEDICINE CLINIC    Provider:  Larey Seat, MD  Primary Care Physician:  Marda Stalker, PA-C [place] [address]     Referring Provider: Briscoe Deutscher , DO         Chief Complaint according to patient   Patient presents with:    . New Patient (Initial Visit)           HISTORY OF PRESENT ILLNESS:  Karla Villa is a 34 year- old Caucasian female patient and seen  on 09/01/2019   Chief concern according to patient : " My family tells me I snore and have woken myself up."  She is referred by Dr. Juleen China from the medical bariatric program.    Karla Villa presents on 09-01-2019, a right -handed Caucasian female who works at [employer] and  has a past medical history of Anxiety, Asthma, Back pain, Bilateral swelling of feet, Chest pain, Constipation, Depression, Eczema, Heartburn, Hypertension, Indigestion, Migraines, Palpitations, Panic attacks, PCOS (polycystic ovarian syndrome), Shortness of breath, and Vertigo..     Sleep relevant medical history: some nightmares, Tonsillectomy at age 79.   Family medical /sleep history: brother with OSA.    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 Family status is single, living with parents a younger sister.  The patient currently works office hours.  Tobacco use- none .  ETOH use ; socially, Caffeine intake in form of Coffee( none) Soda( 1 a day) Tea ( none)- no energy drinks. Regular exercise - walking. Hobbies :walkng the puppy.     Sleep habits are as follows: The patient's dinner time is between 6-7 PM.  The patient goes to bed at 9.30 PM and watches TV- she will sleep within 30 minutes- she continues to sleep for many hours, wakes for rare bathroom break. The preferred sleep position is supine or prone , with the support of 2 pillows.  Dreams are reportedly frequent/vivid.  7 AM is the usual rise time. The patient wakes up with  5 alarms- snoozes them several times .  She reports not feeling refreshed or restored in AM, with symptoms such as dry mouth, and every morning she has headaches , and residual fatigue.  Naps are taken frequently on weekends , lasting from 60-120 minutes and are more refreshing than nocturnal sleep.    Review of Systems: Out of a complete 14 system review, the patient complains of only the following symptoms, and all other reviewed systems are negative.:  Fatigue, sleepiness , snoring fragmented sleep( waking up 4 times on average at night )  hypersomnia. Long sleep time    How likely are you to doze in the following situations: 0 = not likely, 1 = slight chance, 2 = moderate chance, 3 = high chance   Sitting and Reading? Watching Television? Sitting inactive in a public place (theater or meeting)? As a passenger in a car for an hour without a break? Lying down in the afternoon when circumstances permit? Sitting and talking to someone? Sitting quietly after lunch without alcohol? In a car, while stopped for a few minutes in traffic?   Total = 14/ 24 points   FSS endorsed at 44 / 63 points.   Social History   Socioeconomic History  . Marital status: Single    Spouse name: Not on file  . Number of children: Not on file  . Years of education: Not  on file  . Highest education level: Not on file  Occupational History    Employer: [employer]  Tobacco Use  . Smoking status: Never Smoker  . Smokeless tobacco: Never Used  Substance and Sexual Activity  . Alcohol use: Yes  . Drug use: Not Currently  . Sexual activity: Not on file  Other Topics Concern  . Not on file  Social History Narrative  . Not on file   Social Determinants of Health   Financial Resource Strain:   . Difficulty of Paying Living Expenses:   Food Insecurity:   . Worried About Programme researcher, broadcasting/film/video in the Last Year:   . Barista in the Last Year:   Transportation Needs:   . Freight forwarder  (Medical):   Marland Kitchen Lack of Transportation (Non-Medical):   Physical Activity:   . Days of Exercise per Week:   . Minutes of Exercise per Session:   Stress:   . Feeling of Stress :   Social Connections:   . Frequency of Communication with Friends and Family:   . Frequency of Social Gatherings with Friends and Family:   . Attends Religious Services:   . Active Member of Clubs or Organizations:   . Attends Banker Meetings:   Marland Kitchen Marital Status:     Family history , maternal grandparents had DM, paternal history unknown.   Past Medical History:  Diagnosis Date  . Anxiety   . Asthma   . Back pain   . Bilateral swelling of feet   . Chest pain   . Constipation   . Depression   . Eczema   . Heartburn   . Hypertension   . Indigestion   . Migraines   . Palpitations   . Panic attacks   . PCOS (polycystic ovarian syndrome)   . Shortness of breath   . Vertigo     Past Surgical History:  Procedure Laterality Date  . TONSILLECTOMY    . WISDOM TOOTH EXTRACTION       Current Outpatient Medications on File Prior to Visit  Medication Sig Dispense Refill  . acidophilus (RISAQUAD) CAPS capsule Take 1 capsule by mouth daily.    Marland Kitchen albuterol (PROVENTIL HFA;VENTOLIN HFA) 108 (90 BASE) MCG/ACT inhaler Inhale into the lungs every 6 (six) hours as needed for wheezing or shortness of breath.    . ALPRAZolam (XANAX) 0.5 MG tablet Take 0.5 mg by mouth daily as needed for anxiety.    . Ascorbic Acid (VITAMIN C) 1000 MG tablet Take 1,000 mg by mouth daily.    . busPIRone (BUSPAR) 10 MG tablet Take 10 mg by mouth 2 (two) times daily.    . fluticasone (FLONASE) 50 MCG/ACT nasal spray     . ibuprofen (ADVIL) 200 MG tablet Take 800 mg by mouth every 6 (six) hours as needed for fever, headache or moderate pain.     . medroxyPROGESTERone (PROVERA) 5 MG tablet Take 5 mg by mouth daily.    . Multiple Vitamins-Minerals (ZINC PO) Take 1 tablet by mouth daily.    . sertraline (ZOLOFT) 100 MG  tablet Take 100 mg by mouth daily.    . sodium chloride (OCEAN) 0.65 % SOLN nasal spray Place 1 spray into both nostrils as needed for congestion.    . topiramate (TOPAMAX) 50 MG tablet Take 1 tablet (50 mg total) by mouth 2 (two) times daily. 60 tablet 0  . valACYclovir (VALTREX) 1000 MG tablet Take 1,000 mg by  mouth daily.    . Vitamin D, Ergocalciferol, (DRISDOL) 1.25 MG (50000 UT) CAPS capsule Take 1 capsule (50,000 Units total) by mouth every 7 (seven) days. 4 capsule 0   No current facility-administered medications on file prior to visit.    Allergies  Allergen Reactions  . Doxycycline Nausea And Vomiting  . Lamisil [Terbinafine Hcl] Other (See Comments)    Headaches    Physical exam:  Today's Vitals   09/01/19 0844  BP: (!) 139/93  Pulse: 82  Temp: 97.6 F (36.4 C)  Weight: 243 lb (110.2 kg)  Height: 5\' 3"  (1.6 m)   Body mass index is 43.05 kg/m.   Wt Readings from Last 3 Encounters:  09/01/19 243 lb (110.2 kg)  08/09/19 246 lb (111.6 kg)  04/28/19 251 lb (113.9 kg)     Ht Readings from Last 3 Encounters:  09/01/19 5\' 3"  (1.6 m)  08/09/19 5\' 4"  (1.626 m)  04/28/19 5\' 4"  (1.626 m)      General: The patient is awake, alert and appears not in acute distress. The patient is well groomed. Head: Normocephalic, atraumatic. Neck is supple. Mallampati 3 plus ,  neck circumference:17 inches . Nasal airflow patent.  Retrognathia is not  seen.  Dental status:  Intact.  Cardiovascular:  Regular rate and cardiac rhythm by pulse,  without distended neck veins. Respiratory: Lungs are clear to auscultation.  Skin:  Without evidence of ankle edema, or rash. Trunk: The patient's posture is erect.   Neurologic exam : The patient is awake and alert, oriented to place and time.   Memory subjective described as intact.  Attention span & concentration ability appears normal.  Speech is fluent,  without  dysarthria, dysphonia or aphasia.  Mood and affect are appropriate.     Cranial nerves: no loss of smell or taste reported  Pupils are equal and briskly reactive to light. Funduscopic exam deferred. .  Extraocular movements in vertical and horizontal planes were with coarse saccades- to the left -and without nystagmus. No Diplopia. Visual fields by finger perimetry are intact. Hearing was intact to soft voice and finger rubbing.    Facial sensation intact to fine touch.  Facial motor strength is symmetric and tongue and uvula move midline.  Neck ROM : rotation, tilt and flexion extension were normal for age and shoulder shrug was symmetrical.    Motor exam:  Symmetric bulk, tone and ROM.   Normal tone without cog wheeling, strong grip strength on the left and weaker on the right, the dominant hand .   Sensory:  Fine touch, pinprick and vibration were tested  - numbness in the right hands thumb, index and middle finger - spells- not constant- .  Proprioception tested in the upper extremities was normal. Coordination: Rapid alternating movements in the fingers/hands were of normal speed.  The Finger-to-nose maneuver was intact without evidence of ataxia, dysmetria or tremor. Gait and station: Patient could rise unassisted from a seated position, walked without assistive device.  Stance is of wider base and the patient turned with 3 steps.  Toe and heel walk were deferred.  Deep tendon reflexes: in the upper and lower extremities are symmetric and intact.  Babinski response was deferred.      Mrs. Eilert had contracted COVID-19 about 90 days ago and she will now be finally eligible to get the vaccine and is interested.  She has no history of significant surgeries, but she does have a history of weight gain related to both depression  and polycystic ovarian syndrome.  She presents with an Epworth sleepiness score of 14 points which is qualified as excessive daytime sleepiness and she has trouble sleeping through the night waking up spontaneously sometimes by her own  snoring but many times not aware what triggered the arousal.  Anxiety may play a role here as well.  Looking through her medication she is taking BuSpar at night and she has as needed Xanax that she is also on sertraline all medications that should help with nocturnal sleep to some degree.  She is on a slow but steady weight loss plan.  She had been struggling with shortness of breath wheezing while she had contracted Covid on top she has a history of asthma.  She has woken up with morning headaches for the last month every morning.  When she ran out of Zoloft during the pandemic her depression and anxiety had worsened.  She also gained weight.  We are meeting to design sleep study for her and I feel that looking at her labs and medical history she would be fine to be checked by her home sleep test as a screening test.  I will order this through our office.  I would like to add that the patient does not have anemia she is actually a high hemoglobin and higher hematocrit which also is important to be evaluated in a sleep study, and she does not have elevated hemoglobin A1c so that she is not diabetic.  She may be insulin resistant nonetheless.  After spending a total time of 45 minutes face to face and  for physical and neurologic examination, review of laboratory studies,  personal review of imaging studies, reports and results of other testing and review of referral information / records as far as provided in visit, I have established the following assessments:  1) snoring and possible OSA , fragmented sleep and excessive daytime sleepiness.  2) vertigo- has mild eye-movement abnormalities, indicating positional vertigo with gaze to the left or flexion of the neck- bending her head.  3) right sided carpaltunnel.  4) depression, also worsening with COVID pandemic related restrictions.  5) higher Hgb, Hct   My Plan is to proceed with:  1) sleep study to screen for sleep apnea. This can be a HST and it  can be done by  attended sleep study (PSG) .     I would like to thank Dr Earlene Plater  for allowing me to meet with and to take care of this pleasant patient.   In short, CLARKE AMBURN is presenting with snoring and non-restorative sleep , leaving her sleep fragmented and her daytime sleepy. , a symptom that can be attributed to  OSA in the setting of obesity, high grade Mallampati, and high neck size.    I plan to follow up either personally or through our NP within 2-3 month.   CC: I will share my notes with PCP  Electronically signed by: Melvyn Novas, MD 09/01/2019 9:10 AM  [facility] Board certified by The [employer] of Sleep Medicine and Diplomate of the [employer] of Sleep Medicine. Board certified In Neurology through the ABPN, Fellow of the [employer] of Neurology. Medical Director of [facility].

## 2019-09-07 ENCOUNTER — Encounter (INDEPENDENT_AMBULATORY_CARE_PROVIDER_SITE_OTHER): Payer: Self-pay | Admitting: Family Medicine

## 2019-09-13 ENCOUNTER — Encounter (INDEPENDENT_AMBULATORY_CARE_PROVIDER_SITE_OTHER): Payer: Self-pay | Admitting: Family Medicine

## 2019-09-15 ENCOUNTER — Telehealth: Payer: Self-pay

## 2019-09-15 NOTE — Telephone Encounter (Signed)
LVM for pt to call me back to schedule sleep study  

## 2019-09-22 ENCOUNTER — Ambulatory Visit (INDEPENDENT_AMBULATORY_CARE_PROVIDER_SITE_OTHER): Payer: BC Managed Care – PPO | Admitting: Family Medicine

## 2019-09-22 ENCOUNTER — Encounter (INDEPENDENT_AMBULATORY_CARE_PROVIDER_SITE_OTHER): Payer: Self-pay | Admitting: Family Medicine

## 2019-09-22 ENCOUNTER — Other Ambulatory Visit: Payer: Self-pay

## 2019-09-22 VITALS — BP 121/78 | HR 84 | Temp 98.2°F | Ht 63.0 in | Wt 245.0 lb

## 2019-09-22 DIAGNOSIS — E88819 Insulin resistance, unspecified: Secondary | ICD-10-CM

## 2019-09-22 DIAGNOSIS — Z9189 Other specified personal risk factors, not elsewhere classified: Secondary | ICD-10-CM

## 2019-09-22 DIAGNOSIS — E282 Polycystic ovarian syndrome: Secondary | ICD-10-CM

## 2019-09-22 DIAGNOSIS — F3289 Other specified depressive episodes: Secondary | ICD-10-CM

## 2019-09-22 DIAGNOSIS — E559 Vitamin D deficiency, unspecified: Secondary | ICD-10-CM | POA: Diagnosis not present

## 2019-09-22 DIAGNOSIS — E782 Mixed hyperlipidemia: Secondary | ICD-10-CM | POA: Diagnosis not present

## 2019-09-22 DIAGNOSIS — E8881 Metabolic syndrome: Secondary | ICD-10-CM

## 2019-09-22 DIAGNOSIS — Z6841 Body Mass Index (BMI) 40.0 and over, adult: Secondary | ICD-10-CM

## 2019-09-22 DIAGNOSIS — G43809 Other migraine, not intractable, without status migrainosus: Secondary | ICD-10-CM

## 2019-09-22 MED ORDER — BD PEN NEEDLE NANO 2ND GEN 32G X 4 MM MISC: 0 refills | Status: DC

## 2019-09-22 MED ORDER — BUSPIRONE HCL 10 MG PO TABS: 10.0000 mg | ORAL_TABLET | Freq: Two times a day (BID) | ORAL | 1 refills | Status: DC

## 2019-09-22 MED ORDER — SERTRALINE HCL 100 MG PO TABS: 100.0000 mg | ORAL_TABLET | Freq: Every day | ORAL | 1 refills | Status: DC

## 2019-09-22 MED ORDER — TOPIRAMATE 50 MG PO TABS: 50.0000 mg | ORAL_TABLET | Freq: Two times a day (BID) | ORAL | 1 refills | Status: DC

## 2019-09-22 NOTE — Progress Notes (Signed)
Chief Complaint:   OBESITY Karla Villa is here to discuss her progress with her obesity treatment plan along with follow-up of her obesity related diagnoses. Karla Villa is on the Category 1 Plan and states she is following her eating plan approximately 65% of the time. Karla Villa states she is walking 1-2 miles 2 times per week.  Today's visit was #: 6 Starting weight: 265 lbs Starting date: 03/29/2019 Today's weight: 245 lbs Today's date: 09/22/2019 Total lbs lost to date: 20 lbs Total lbs lost since last in-office visit: 0  Interim History: Karla Villa reports increased stress at work.  She has not been exercising.  She says she has been doing more emotional eating.  She is due for labs at the next visit.  Subjective:   1. Insulin resistance Karla Villa has a diagnosis of insulin resistance based on her elevated fasting insulin level >5. She continues to work on diet and exercise to decrease her risk of diabetes.  Lab Results  Component Value Date   INSULIN 29.3 (H) 08/09/2019   INSULIN 26.0 (H) 03/29/2019   Lab Results  Component Value Date   HGBA1C 4.9 03/29/2019   2. PCOS (polycystic ovarian syndrome) Karla Villa has PCOS.  She is no longer taking an OCP.  3. Mixed hyperlipidemia Karla Villa has mixed hyperlipidemia and has been trying to improve her cholesterol levels with intensive lifestyle modification including a low saturated fat diet, exercise and weight loss. She denies any chest pain, claudication or myalgias.  Lab Results  Component Value Date   ALT 30 05/31/2019   AST 25 05/31/2019   ALKPHOS 62 05/31/2019   BILITOT 0.6 05/31/2019   Lab Results  Component Value Date   CHOL 208 (H) 03/29/2019   HDL 56 03/29/2019   LDLCALC 124 (H) 03/29/2019   TRIG 159 (H) 03/29/2019   4. Vitamin D deficiency Karla Villa's Vitamin D level was 16.5 on 08/09/2019. She is currently taking prescription vitamin D 50,000 IU every 3-4 days. She denies nausea, vomiting or muscle weakness.  5. Other  migraine without status migrainosus, not intractable Karla Villa is taking Topamax 50 mg daily for her migraines.  6. Other depression, with emotional eating Karla Villa is struggling with emotional eating and using food for comfort to the extent that it is negatively impacting her health. She has been working on behavior modification techniques to help reduce her emotional eating and has been minimally successful. She shows no sign of suicidal or homicidal ideations.  She is taking Buspar and Wellbutrin.  She is also taking Saxenda 1.2 mL daily with no side effects.  7. At risk for deficient intake of food The patient is at a higher than average risk of deficient intake of food.  Assessment/Plan:   1. Insulin resistance, PCOS Karla Villa will continue to work on weight loss, exercise, and decreasing simple carbohydrates to help decrease the risk of diabetes. Karla Villa agreed to follow-up with [place] as directed to closely monitor her progress.  2. PCOS (polycystic ovarian syndrome) Intensive lifestyle modifications are first line treatment for this issue. We discussed several lifestyle modifications today and she will continue to work on diet, exercise and weight loss efforts. Orders and follow up as documented in patient record.  Counseling . PCOS is a leading cause of menstrual irregularities and infertility. It is also associated with obesity, hirsutism (excessive hair growth on the face, chest, or back), and cardiovascular risk factors such as high cholesterol and insulin resistance. . Insulin resistance appears to play a central role.  Marland Kitchen  Women with PCOS have been shown to have impaired appetite-regulating hormones. . Metformin is one medication that can improve metabolic parameters.  . Women with polycystic ovary syndrome (PCOS) have an increased risk for cardiovascular disease (CVD) - European Journal of Preventive Cardiology.  3. Mixed hyperlipidemia Cardiovascular risk and specific lipid/LDL goals  reviewed.  We discussed several lifestyle modifications today and Karla Villa will continue to work on diet, exercise and weight loss efforts. Orders and follow up as documented in patient record.   Counseling Intensive lifestyle modifications are the first line treatment for this issue. . Dietary changes: Increase soluble fiber. Decrease simple carbohydrates. . Exercise changes: Moderate to vigorous-intensity aerobic activity 150 minutes per week if tolerated. . Lipid-lowering medications: see documented in medical record.  4. Vitamin D deficiency Low Vitamin D level contributes to fatigue and are associated with obesity, breast, and colon cancer. She agrees to continue to take prescription Vitamin D @50 ,000 IU every 3-4 days and will follow-up for routine testing of Vitamin D, at least 2-3 times per year to avoid over-replacement.  5. Other migraine without status migrainosus, not intractable Karla Villa will continue taking Topamax for migraine prevention.  Orders - topiramate (TOPAMAX) 50 MG tablet; Take 1 tablet (50 mg total) by mouth 2 (two) times daily.  Dispense: 180 tablet; Refill: 1  6. Other depression, with emotional eating Behavior modification techniques were discussed today to help Karla Villa deal with her emotional/non-hunger eating behaviors.  Orders and follow up as documented in patient record.   Orders - busPIRone (BUSPAR) 10 MG tablet; Take 1 tablet (10 mg total) by mouth 2 (two) times daily.  Dispense: 180 tablet; Refill: 1 - sertraline (ZOLOFT) 100 MG tablet; Take 1 tablet (100 mg total) by mouth daily.  Dispense: 90 tablet; Refill: 1  7. At risk for deficient intake of food Karla Villa was given approximately 15 minutes of deficit intake of food prevention counseling today. Karla Villa is at risk for eating too few calories based on current food recall. She was encouraged to focus on meeting caloric and protein goals according to her recommended meal plan.   8. Class 3 severe obesity  with serious comorbidity and body mass index (BMI) of 40.0 to 44.9 in adult, unspecified obesity type (HCC) - Insulin Pen Needle (BD PEN NEEDLE NANO 2ND GEN) 32G X 4 MM MISC; Use 1 needle with Saxenda pen subcutaneously daily.  Dispense: 100 each; Refill: 0  Karla Villa is currently in the action stage of change. As such, her goal is to continue with weight loss efforts. She has agreed to the Category 1 Plan.   Exercise goals: As is.  Behavioral modification strategies: increasing lean protein intake, increasing water intake and emotional eating strategies.  Karla Villa has agreed to follow-up with our clinic in 2 weeks. She was informed of the importance of frequent follow-up visits to maximize her success with intensive lifestyle modifications for her multiple health conditions.   Objective:   Blood pressure 121/78, pulse 84, temperature 98.2 F (36.8 C), temperature source Oral, height 5\' 3"  (1.6 m), weight 245 lb (111.1 kg), last menstrual period 08/27/2019, SpO2 95 %. Body mass index is 43.4 kg/m.  General: Cooperative, alert, well developed, in no acute distress. HEENT: Conjunctivae and lids unremarkable. Cardiovascular: Regular rhythm.  Lungs: Normal work of breathing. Neurologic: No focal deficits.   Lab Results  Component Value Date   CREATININE 0.82 05/31/2019   BUN 11 05/31/2019   NA 138 05/31/2019   K 3.4 (L) 05/31/2019   CL  106 05/31/2019   CO2 22 05/31/2019   Lab Results  Component Value Date   ALT 30 05/31/2019   AST 25 05/31/2019   ALKPHOS 62 05/31/2019   BILITOT 0.6 05/31/2019   Lab Results  Component Value Date   HGBA1C 4.9 03/29/2019   Lab Results  Component Value Date   INSULIN 29.3 (H) 08/09/2019   INSULIN 26.0 (H) 03/29/2019   Lab Results  Component Value Date   TSH 1.800 03/29/2019   Lab Results  Component Value Date   CHOL 208 (H) 03/29/2019   HDL 56 03/29/2019   LDLCALC 124 (H) 03/29/2019   TRIG 159 (H) 03/29/2019   Lab Results  Component  Value Date   WBC 6.7 05/31/2019   HGB 16.2 (H) 05/31/2019   HCT 47.6 (H) 05/31/2019   MCV 92.6 05/31/2019   PLT 182 05/31/2019   Attestation Statements:   Reviewed by clinician on day of visit: allergies, medications, problem list, medical history, surgical history, family history, social history, and previous encounter notes.  I, Insurance claims handler, CMA, am acting as Energy manager for [employer], DO.  I have reviewed the above documentation for accuracy and completeness, and I agree with the above. Helane Rima, DO

## 2019-09-29 ENCOUNTER — Telehealth: Payer: Self-pay

## 2019-09-29 NOTE — Telephone Encounter (Signed)
LVM for pt to call me back to schedule sleep study  

## 2019-10-03 ENCOUNTER — Other Ambulatory Visit (INDEPENDENT_AMBULATORY_CARE_PROVIDER_SITE_OTHER): Payer: Self-pay | Admitting: Family Medicine

## 2019-10-03 DIAGNOSIS — E559 Vitamin D deficiency, unspecified: Secondary | ICD-10-CM

## 2019-10-04 ENCOUNTER — Encounter (INDEPENDENT_AMBULATORY_CARE_PROVIDER_SITE_OTHER): Payer: Self-pay | Admitting: Family Medicine

## 2019-10-05 ENCOUNTER — Telehealth: Payer: Self-pay

## 2019-10-05 NOTE — Telephone Encounter (Signed)
Please r/s

## 2019-10-05 NOTE — Telephone Encounter (Signed)
We have attempted to call the patient two times to schedule sleep study.  Patient has been unavailable at the phone numbers we have on file and has not returned our calls. If patient calls back we will schedule them for their sleep study.  

## 2019-10-06 ENCOUNTER — Ambulatory Visit (INDEPENDENT_AMBULATORY_CARE_PROVIDER_SITE_OTHER): Payer: BC Managed Care – PPO | Admitting: Family Medicine

## 2019-10-09 ENCOUNTER — Other Ambulatory Visit (INDEPENDENT_AMBULATORY_CARE_PROVIDER_SITE_OTHER): Payer: Self-pay | Admitting: Family Medicine

## 2019-10-25 ENCOUNTER — Encounter (INDEPENDENT_AMBULATORY_CARE_PROVIDER_SITE_OTHER): Payer: Self-pay | Admitting: Family Medicine

## 2019-10-25 NOTE — Telephone Encounter (Signed)
Please review

## 2019-10-27 ENCOUNTER — Ambulatory Visit (INDEPENDENT_AMBULATORY_CARE_PROVIDER_SITE_OTHER): Payer: BC Managed Care – PPO | Admitting: Family Medicine

## 2019-12-06 DIAGNOSIS — Z20822 Contact with and (suspected) exposure to covid-19: Secondary | ICD-10-CM | POA: Diagnosis not present

## 2019-12-21 DIAGNOSIS — Z1159 Encounter for screening for other viral diseases: Secondary | ICD-10-CM | POA: Diagnosis not present

## 2019-12-21 DIAGNOSIS — Z113 Encounter for screening for infections with a predominantly sexual mode of transmission: Secondary | ICD-10-CM | POA: Diagnosis not present

## 2019-12-21 DIAGNOSIS — Z118 Encounter for screening for other infectious and parasitic diseases: Secondary | ICD-10-CM | POA: Diagnosis not present

## 2019-12-21 DIAGNOSIS — Z114 Encounter for screening for human immunodeficiency virus [HIV]: Secondary | ICD-10-CM | POA: Diagnosis not present

## 2019-12-21 IMAGING — CR DG CHEST 2V
2 series · 2 of 2 positions shown · non-contrast
Comparison: None.

CLINICAL DATA: 32-year-old female with chest pain and shortness of
breath for 1 week.

EXAM:
CHEST - 2 VIEW

[w chest pa]
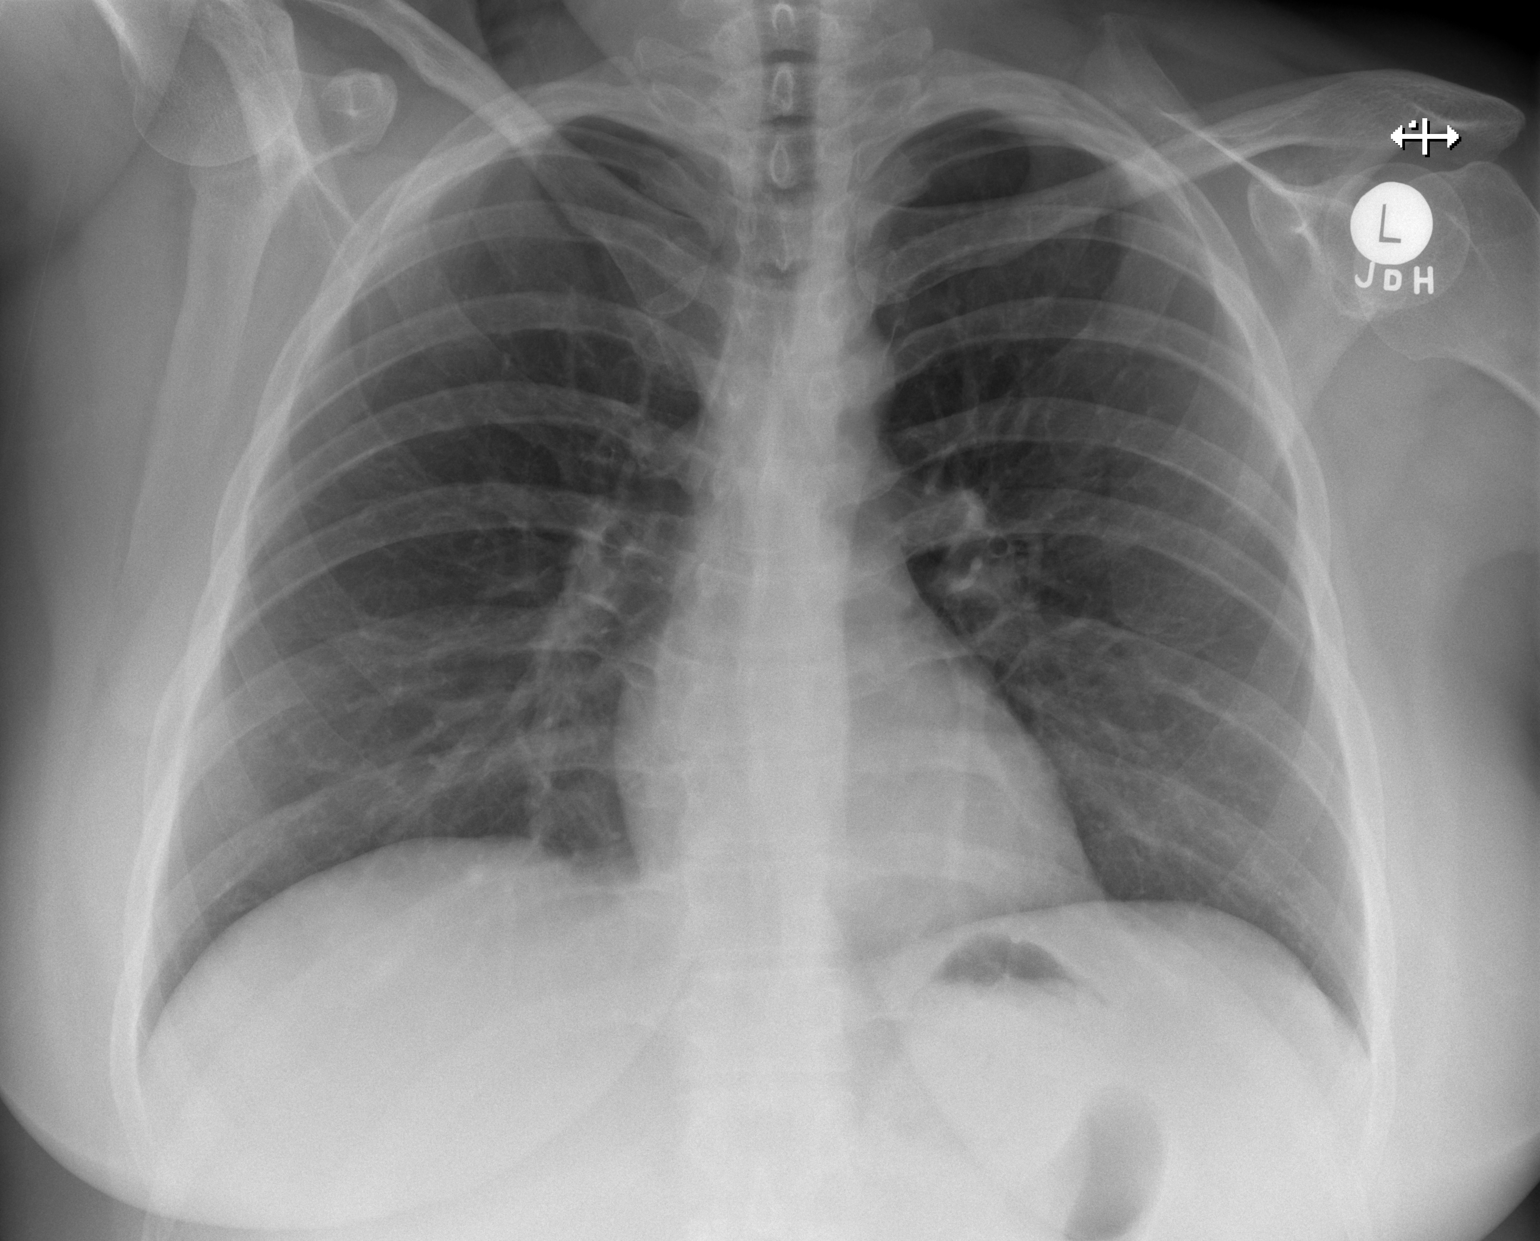

[w chest lat]
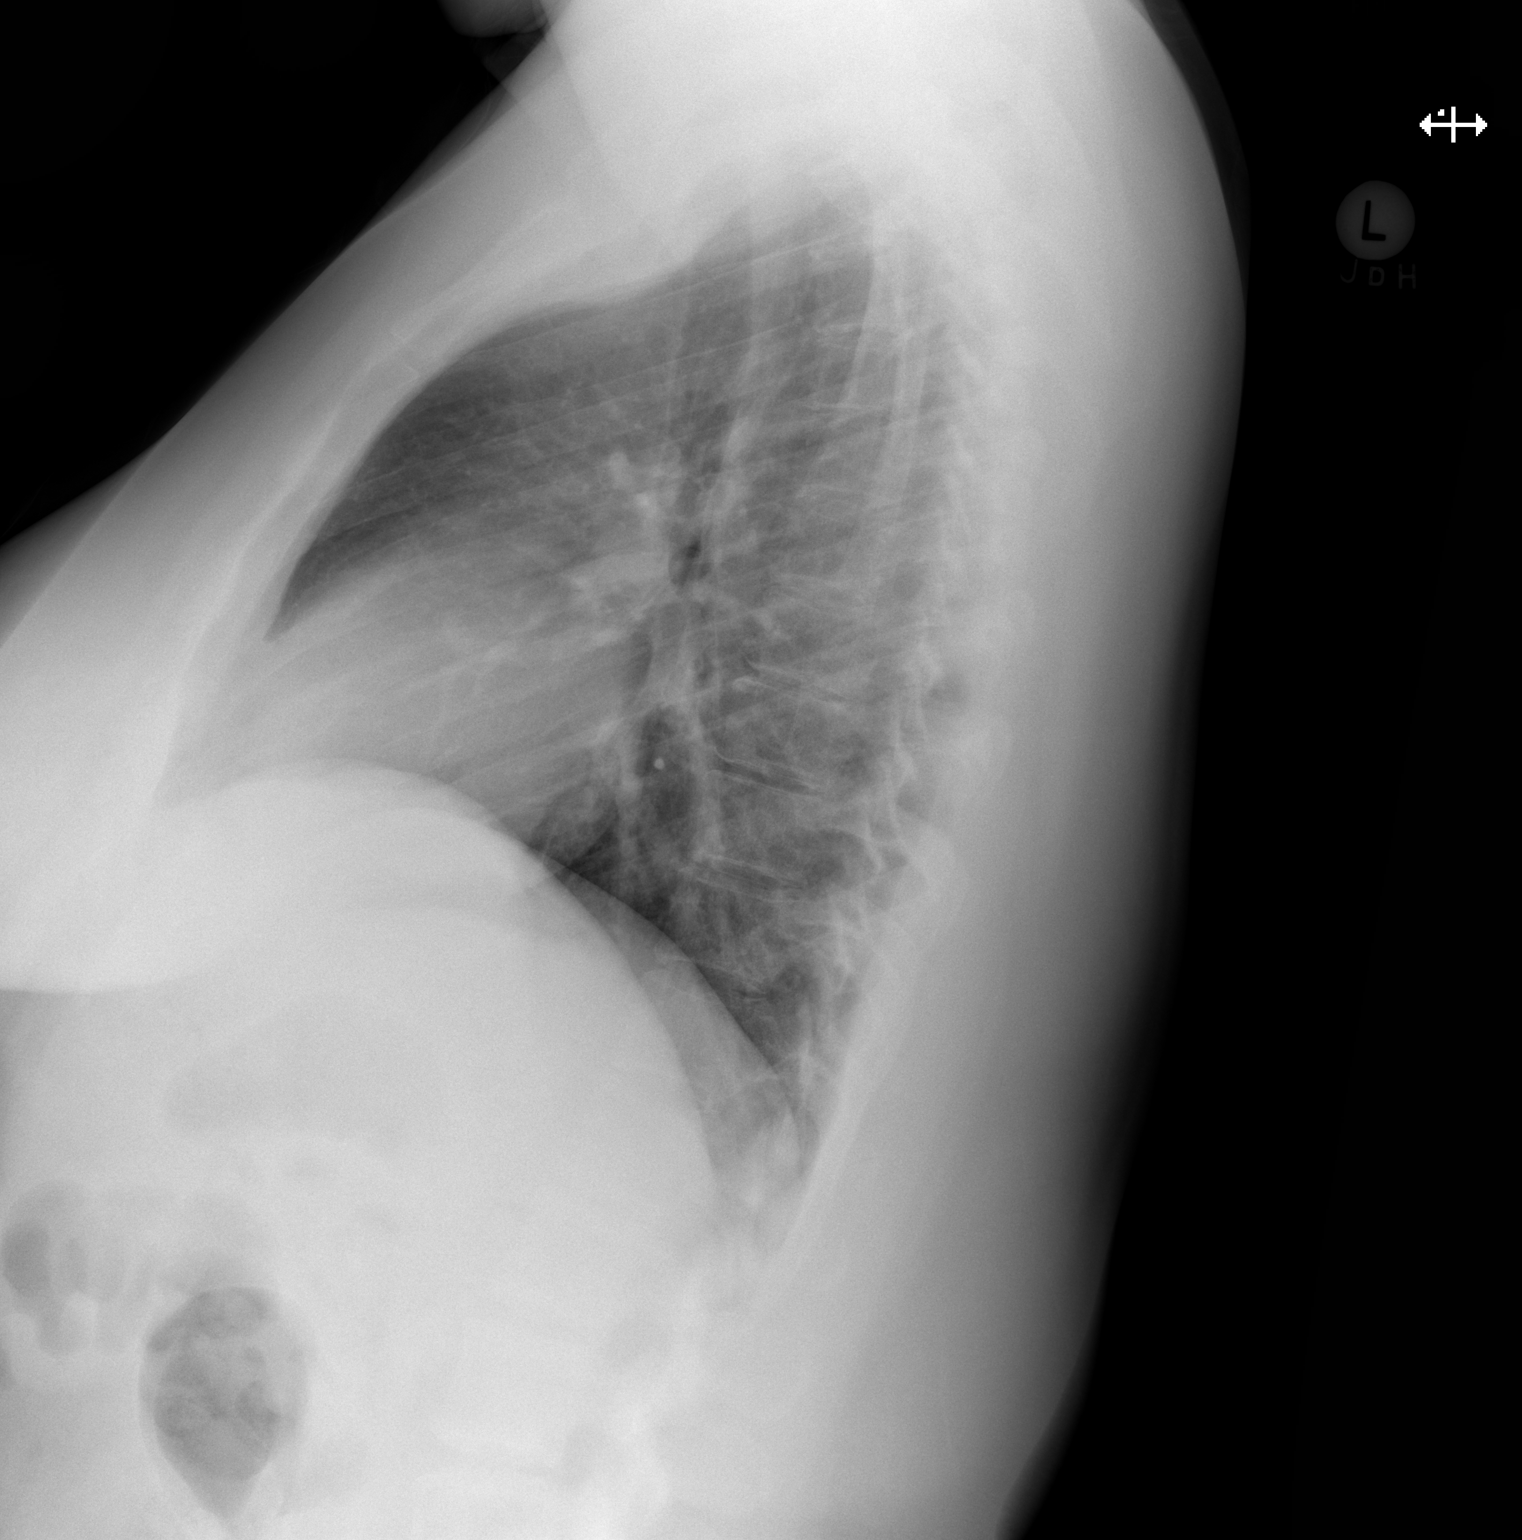

[2 of 2 positions shown; findings below may reference images not displayed]

FINDINGS: Normal cardiac size and mediastinal contours. Visualized tracheal
air column is within normal limits. Normal lung volumes. Both lungs
appear within normal limits. No pleural effusion or abnormal
opacity.

Negative visible osseous structures and bowel gas pattern.
IMPRESSION: Negative.  No cardiopulmonary abnormality.

## 2020-05-11 DIAGNOSIS — Z20822 Contact with and (suspected) exposure to covid-19: Secondary | ICD-10-CM | POA: Diagnosis not present

## 2020-08-21 DIAGNOSIS — Z20822 Contact with and (suspected) exposure to covid-19: Secondary | ICD-10-CM | POA: Diagnosis not present

## 2020-10-06 IMAGING — US US ABDOMEN LIMITED
1 series · 14 of 25 positions shown · non-contrast
Comparison: None.

CLINICAL DATA: Epigastric region pain

EXAM:
ULTRASOUND ABDOMEN LIMITED RIGHT UPPER QUADRANT

[Series 1: us abdomen limited · 0.20mm/px · 14 of 51 slices shown]
[im 1/51]
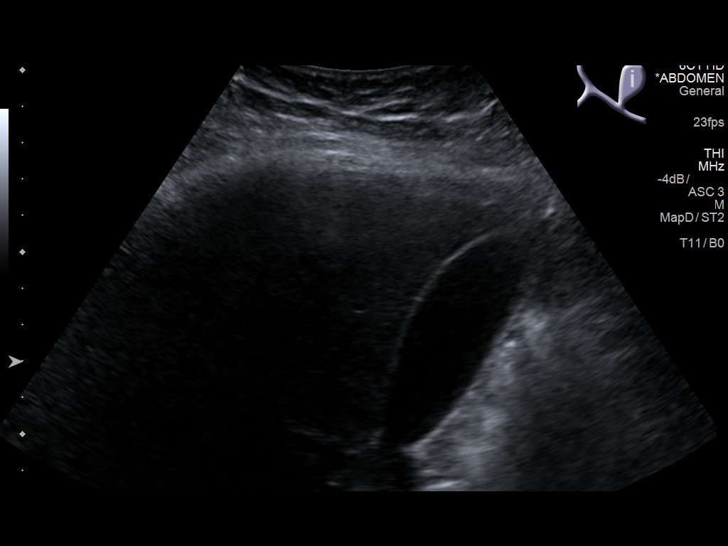
[im 5/51]
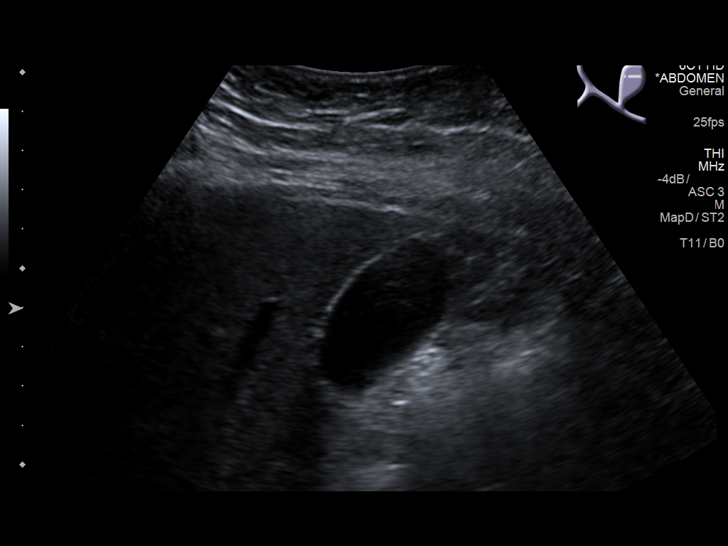
[im 9/51]
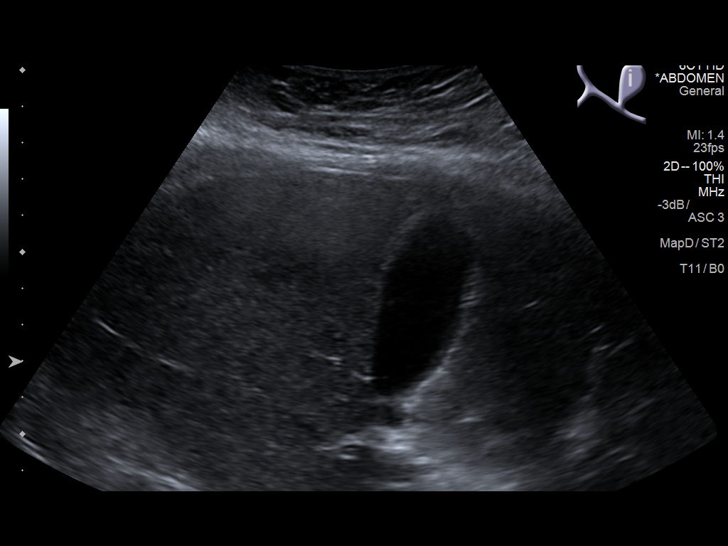
[im 13/51]
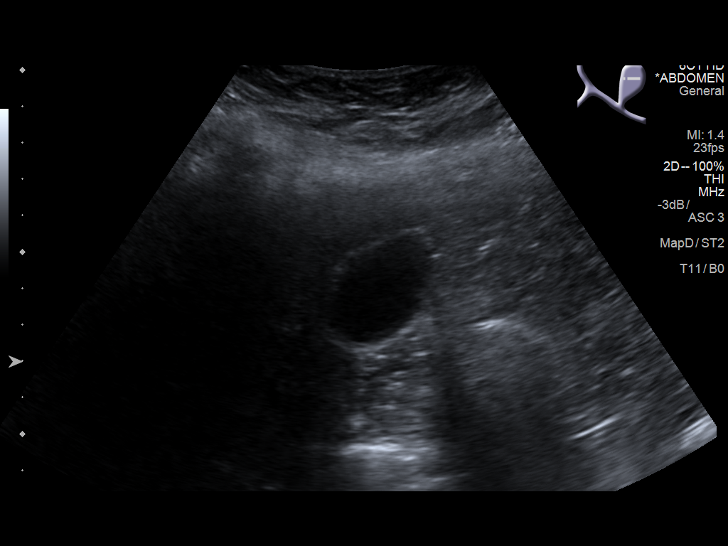
[im 17/51]
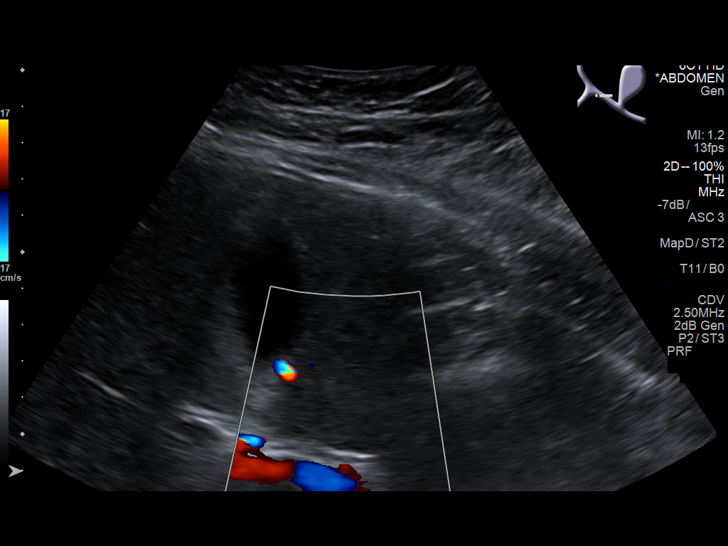
[im 19/51]
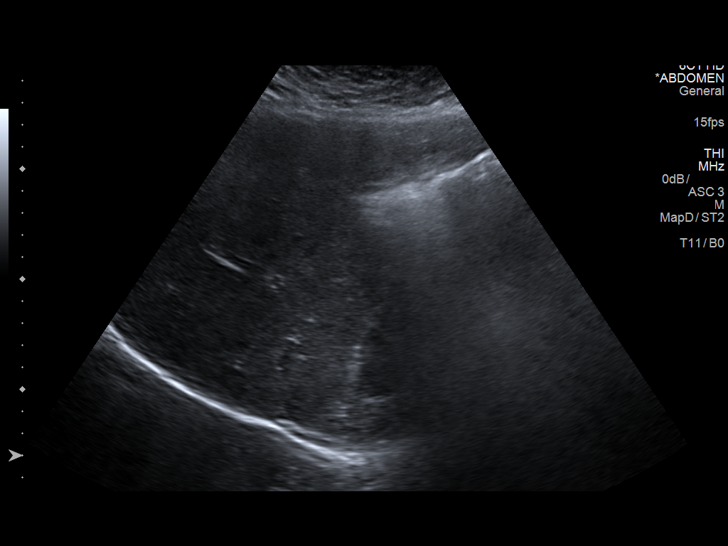
[im 23/51]
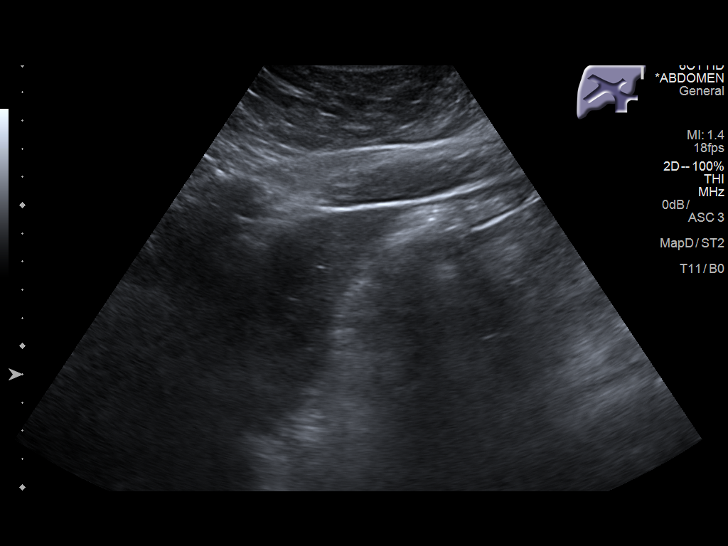
[im 28/51]
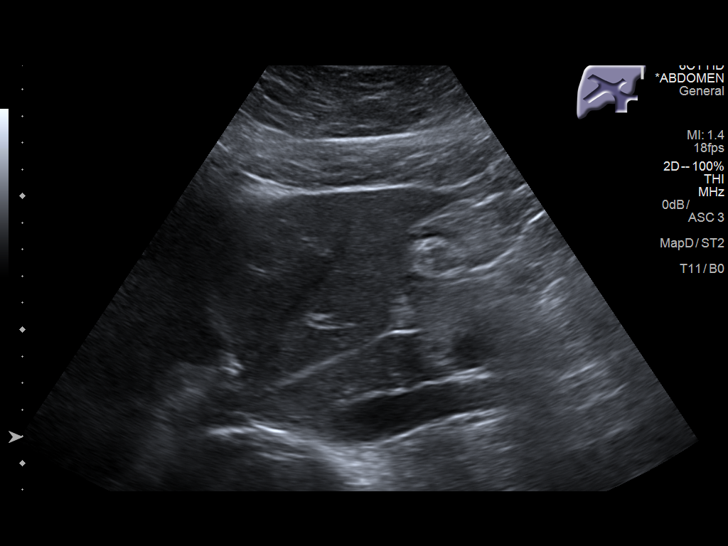
[im 32/51]
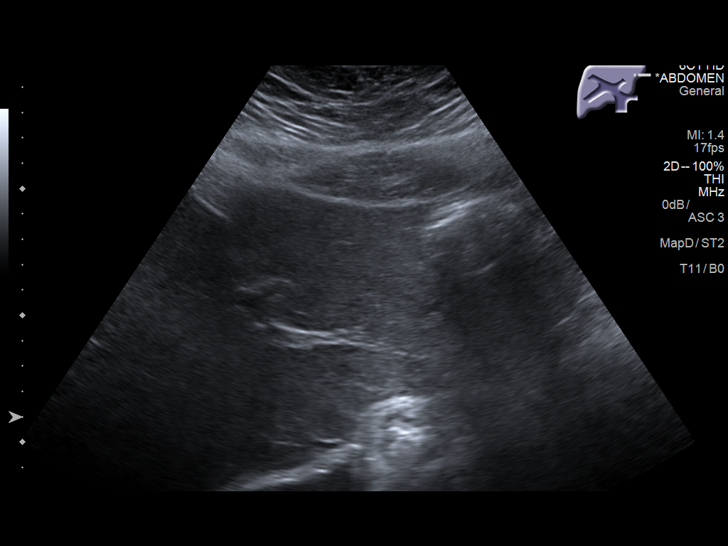
[im 34/51]
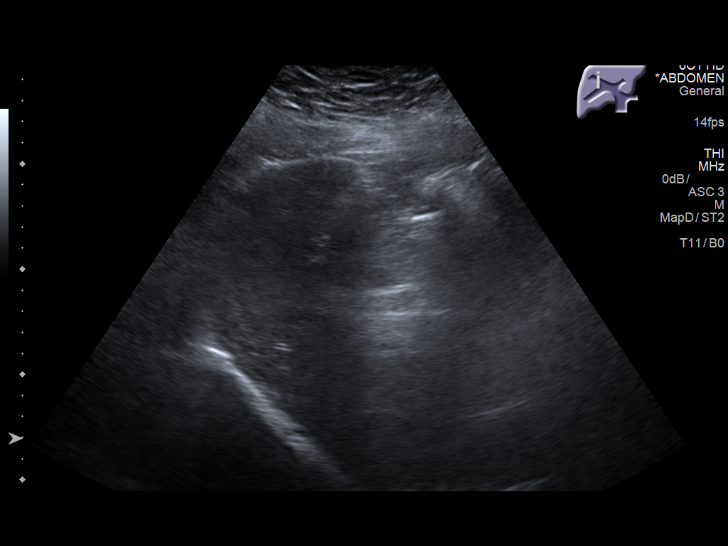
[im 38/51]
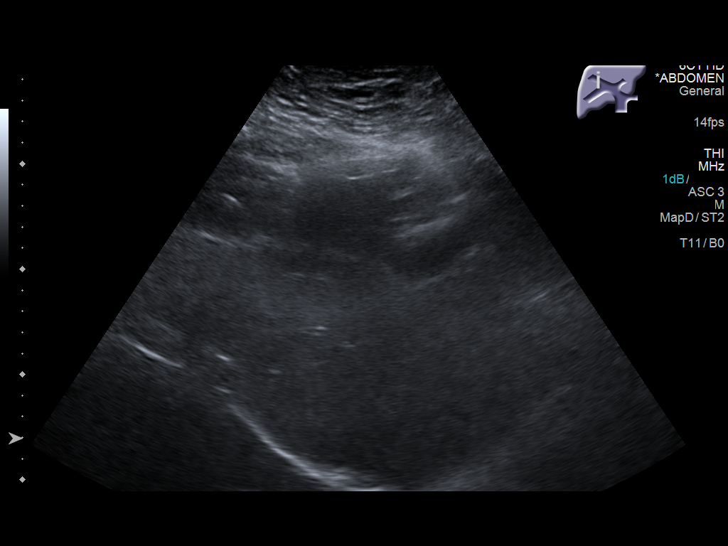
[im 42/51]
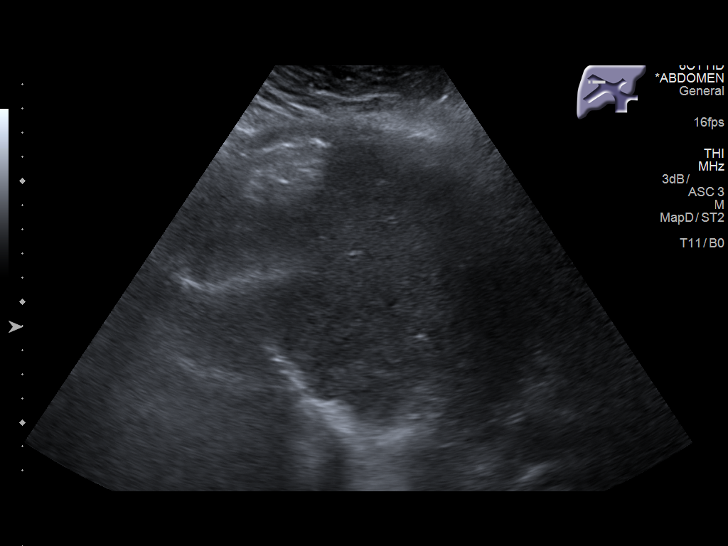
[im 46/51]
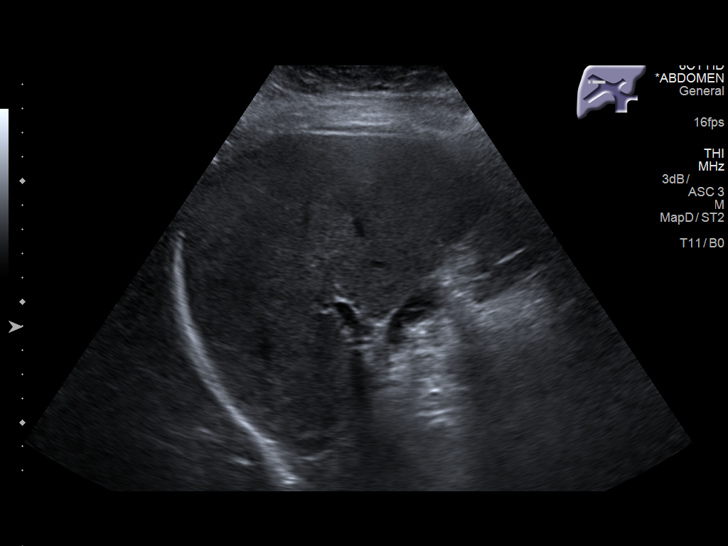
[im 51/51]
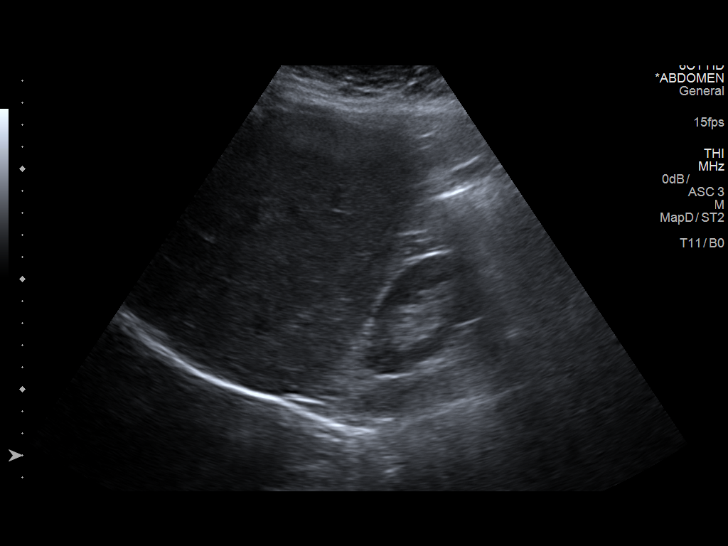

[14 of 25 positions shown; findings below may reference images not displayed]

FINDINGS: Gallbladder:

No gallstones or wall thickening visualized. There is no
pericholecystic fluid. No sonographic Murphy sign noted by
sonographer.

Common bile duct:

Diameter: 2 mm. No intrahepatic or extrahepatic biliary duct
dilatation.

Liver:

No focal lesion identified. Within normal limits in parenchymal
echogenicity. Portal vein is patent on color Doppler imaging with
normal direction of blood flow towards the liver.
IMPRESSION: Study within normal limits.

## 2020-11-08 DIAGNOSIS — Z20822 Contact with and (suspected) exposure to covid-19: Secondary | ICD-10-CM | POA: Diagnosis not present

## 2020-11-23 DIAGNOSIS — Z20822 Contact with and (suspected) exposure to covid-19: Secondary | ICD-10-CM | POA: Diagnosis not present

## 2020-11-25 IMAGING — DX DG CHEST 1V PORT
1 series · 1 of 1 positions shown · non-contrast
Comparison: 06/25/2018.

CLINICAL DATA: Cough, shortness of breath.

EXAM:
PORTABLE CHEST 1 VIEW

[chest ap]
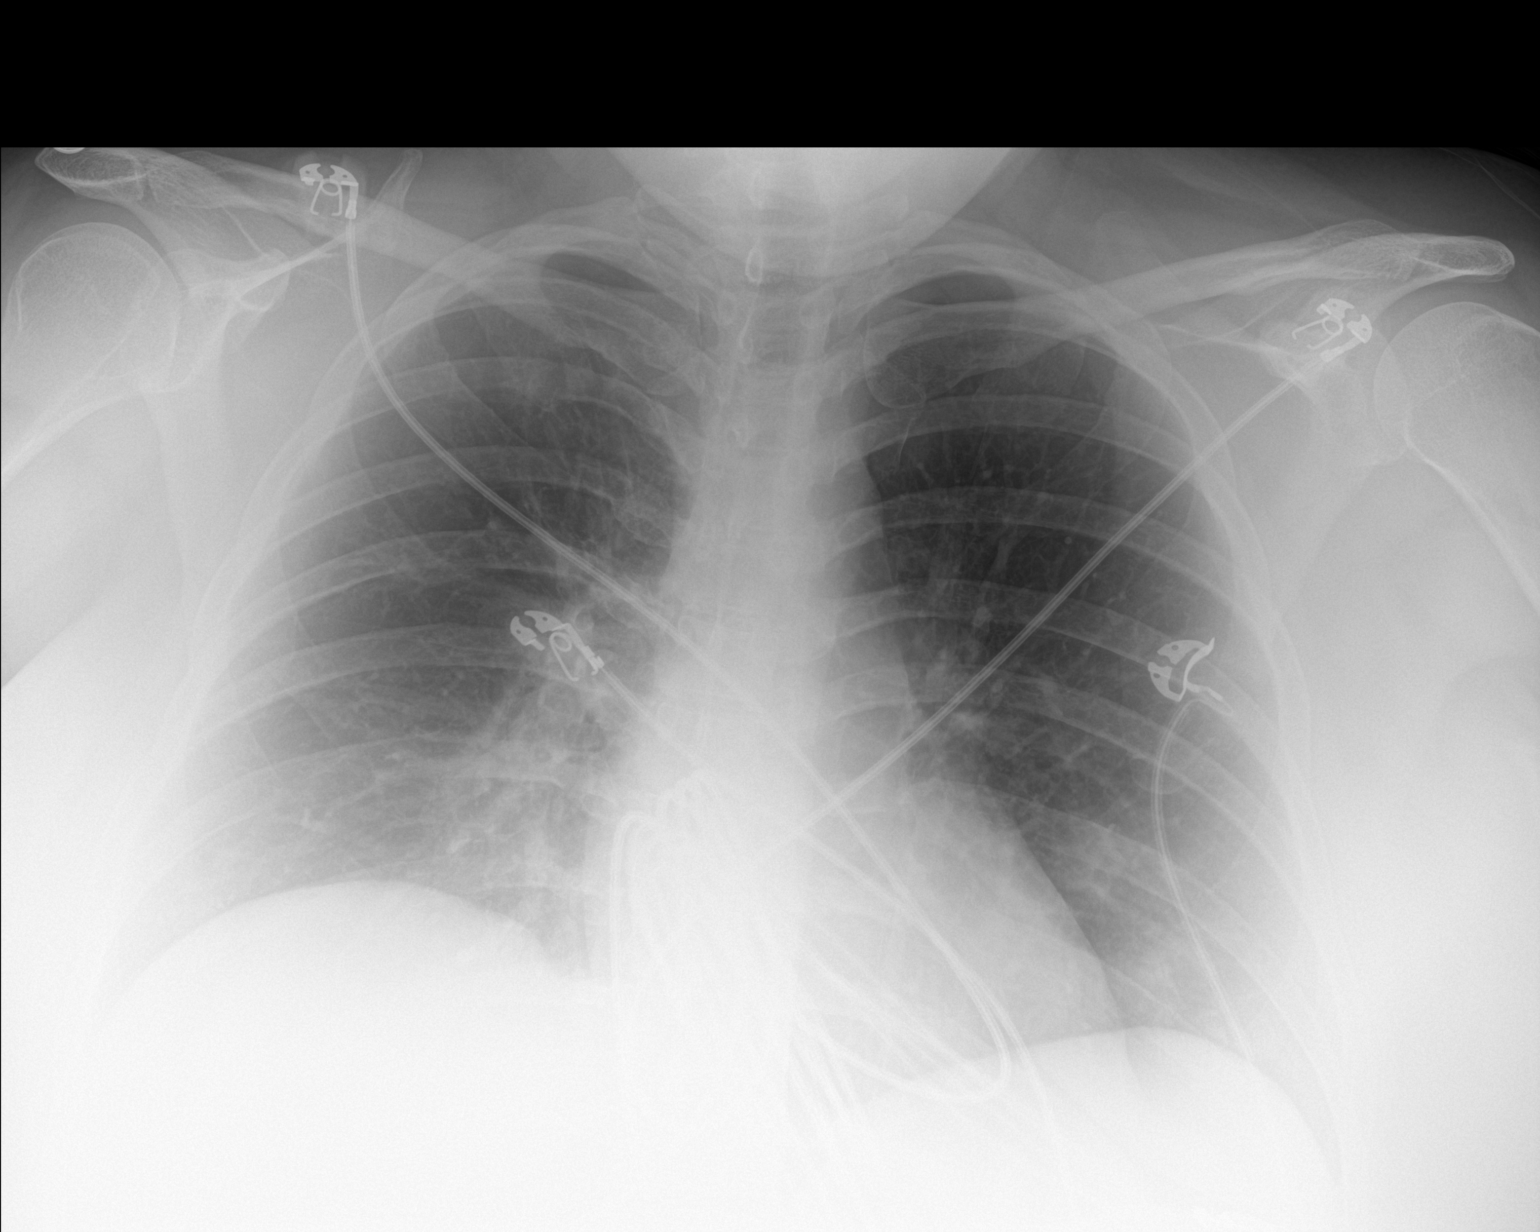

[1 of 1 positions shown; findings below may reference images not displayed]

FINDINGS: Trachea is midline. Heart size normal. Added density in the right
infrahilar region is felt to be due to overlying soft tissue. Lungs
are clear. No pleural fluid.
IMPRESSION: No acute findings.

## 2021-02-12 DIAGNOSIS — Z118 Encounter for screening for other infectious and parasitic diseases: Secondary | ICD-10-CM | POA: Diagnosis not present

## 2021-02-12 DIAGNOSIS — Z01419 Encounter for gynecological examination (general) (routine) without abnormal findings: Secondary | ICD-10-CM | POA: Diagnosis not present

## 2021-02-13 DIAGNOSIS — Z13220 Encounter for screening for lipoid disorders: Secondary | ICD-10-CM | POA: Diagnosis not present

## 2021-02-13 DIAGNOSIS — Z1159 Encounter for screening for other viral diseases: Secondary | ICD-10-CM | POA: Diagnosis not present

## 2021-02-13 DIAGNOSIS — Z131 Encounter for screening for diabetes mellitus: Secondary | ICD-10-CM | POA: Diagnosis not present

## 2021-02-13 DIAGNOSIS — Z0000 Encounter for general adult medical examination without abnormal findings: Secondary | ICD-10-CM | POA: Diagnosis not present

## 2021-02-13 DIAGNOSIS — Z113 Encounter for screening for infections with a predominantly sexual mode of transmission: Secondary | ICD-10-CM | POA: Diagnosis not present

## 2021-02-13 DIAGNOSIS — Z114 Encounter for screening for human immunodeficiency virus [HIV]: Secondary | ICD-10-CM | POA: Diagnosis not present

## 2021-02-13 DIAGNOSIS — Z1329 Encounter for screening for other suspected endocrine disorder: Secondary | ICD-10-CM | POA: Diagnosis not present

## 2021-12-12 ENCOUNTER — Encounter (INDEPENDENT_AMBULATORY_CARE_PROVIDER_SITE_OTHER): Payer: Self-pay

## 2021-12-18 ENCOUNTER — Other Ambulatory Visit (HOSPITAL_BASED_OUTPATIENT_CLINIC_OR_DEPARTMENT_OTHER): Payer: Self-pay

## 2021-12-18 MED ORDER — WEGOVY 0.5 MG/0.5ML ~~LOC~~ SOAJ
SUBCUTANEOUS | 0 refills | Status: DC
  Filled 2021-12-18: qty 2, 28d supply, fill #0

## 2021-12-21 ENCOUNTER — Other Ambulatory Visit (HOSPITAL_BASED_OUTPATIENT_CLINIC_OR_DEPARTMENT_OTHER): Payer: Self-pay

## 2022-08-14 ENCOUNTER — Emergency Department (HOSPITAL_COMMUNITY): Payer: 59

## 2022-08-14 ENCOUNTER — Other Ambulatory Visit: Payer: Self-pay

## 2022-08-14 ENCOUNTER — Emergency Department (HOSPITAL_COMMUNITY)
Admission: EM | Admit: 2022-08-14 | Discharge: 2022-08-14 | Disposition: A | Discharge status: Home / Self Care | Payer: 59 | Attending: Emergency Medicine | Admitting: Emergency Medicine

## 2022-08-14 ENCOUNTER — Encounter (HOSPITAL_COMMUNITY): Payer: Self-pay

## 2022-08-14 DIAGNOSIS — Z794 Long term (current) use of insulin: Secondary | ICD-10-CM | POA: Diagnosis not present

## 2022-08-14 DIAGNOSIS — R519 Headache, unspecified: Secondary | ICD-10-CM | POA: Diagnosis present

## 2022-08-14 DIAGNOSIS — J329 Chronic sinusitis, unspecified: Secondary | ICD-10-CM

## 2022-08-14 DIAGNOSIS — I10 Essential (primary) hypertension: Secondary | ICD-10-CM | POA: Diagnosis not present

## 2022-08-14 DIAGNOSIS — J45909 Unspecified asthma, uncomplicated: Secondary | ICD-10-CM | POA: Insufficient documentation

## 2022-08-14 DIAGNOSIS — Z7984 Long term (current) use of oral hypoglycemic drugs: Secondary | ICD-10-CM | POA: Insufficient documentation

## 2022-08-14 DIAGNOSIS — Z7951 Long term (current) use of inhaled steroids: Secondary | ICD-10-CM | POA: Insufficient documentation

## 2022-08-14 DIAGNOSIS — I2510 Atherosclerotic heart disease of native coronary artery without angina pectoris: Secondary | ICD-10-CM | POA: Insufficient documentation

## 2022-08-14 HISTORY — DX: Atherosclerotic heart disease of native coronary artery without angina pectoris: I25.10

## 2022-08-14 MED ORDER — LORATADINE 10 MG PO TABS: 10.0000 mg | ORAL_TABLET | Freq: Every day | ORAL | 0 refills | Status: DC

## 2022-08-14 MED ORDER — PROCHLORPERAZINE EDISYLATE 10 MG/2ML IJ SOLN
10.0000 mg | Freq: Once | INTRAMUSCULAR | Status: AC
  Administered 2022-08-14: 10 mg via INTRAVENOUS
  Filled 2022-08-14: qty 2

## 2022-08-14 MED ORDER — ACETAMINOPHEN 500 MG PO TABS
1000.0000 mg | ORAL_TABLET | Freq: Once | ORAL | Status: AC
  Administered 2022-08-14: 1000 mg via ORAL
  Filled 2022-08-14: qty 2

## 2022-08-14 MED ORDER — KETOROLAC TROMETHAMINE 30 MG/ML IJ SOLN
30.0000 mg | Freq: Once | INTRAMUSCULAR | Status: AC
  Administered 2022-08-14: 30 mg via INTRAVENOUS
  Filled 2022-08-14: qty 1

## 2022-08-14 MED ORDER — SODIUM CHLORIDE 0.9 % IV BOLUS
1000.0000 mL | Freq: Once | INTRAVENOUS | Status: AC
  Administered 2022-08-14: 1000 mL via INTRAVENOUS

## 2022-08-14 MED ORDER — PROMETHAZINE HCL 25 MG PO TABS: 25.0000 mg | ORAL_TABLET | Freq: Four times a day (QID) | ORAL | 0 refills | Status: DC | PRN

## 2022-08-14 MED ORDER — OXYMETAZOLINE HCL 0.05 % NA SOLN: 1.0000 | Freq: Two times a day (BID) | NASAL | 0 refills | Status: AC

## 2022-08-14 MED ORDER — MORPHINE SULFATE (PF) 4 MG/ML IV SOLN
4.0000 mg | Freq: Once | INTRAVENOUS | Status: AC
  Administered 2022-08-14: 4 mg via INTRAVENOUS
  Filled 2022-08-14: qty 1

## 2022-08-14 MED ORDER — DEXAMETHASONE 4 MG PO TABS
6.0000 mg | ORAL_TABLET | Freq: Once | ORAL | Status: AC
  Administered 2022-08-14: 6 mg via ORAL
  Filled 2022-08-14: qty 1

## 2022-08-14 MED ORDER — DIPHENHYDRAMINE HCL 50 MG/ML IJ SOLN
25.0000 mg | Freq: Once | INTRAMUSCULAR | Status: AC
  Administered 2022-08-14: 25 mg via INTRAVENOUS
  Filled 2022-08-14: qty 1

## 2022-08-14 MED ORDER — MAGNESIUM SULFATE 2 GM/50ML IV SOLN
2.0000 g | Freq: Once | INTRAVENOUS | Status: AC
  Administered 2022-08-14: 2 g via INTRAVENOUS
  Filled 2022-08-14: qty 50

## 2022-08-14 MED ORDER — ONDANSETRON 4 MG PO TBDP
4.0000 mg | ORAL_TABLET | Freq: Once | ORAL | Status: AC
  Administered 2022-08-14: 4 mg via ORAL
  Filled 2022-08-14: qty 1

## 2022-08-14 NOTE — ED Provider Notes (Signed)
  Provider Note MRN:  [id number]  Arrival date & time: 08/14/22    ED Course and Medical Decision Making  Assumed care from [place] at shift change.  See note from prior team for complete details, in brief:   37 yo female Hx migraine but with different type ha today Currently taking oral abx for dental abnormality Minimal relief w/ headache cocktail from overnight team [facility] with sinusitis MRI ordered  Plan per prior physician f/u MRI  MRI completed, also shows sinusitis, no acute intracranial abnormality She is feeling better after intervention, tolerating p.o. Will give oral steroids She has been seen by [facility] neurology previously, will give referral to follow-up [facility]ergan for home, decongestant, strict return precautions were discussed with patient and family bedside   Patient presents with headache. Based on the patient's history and physical there is very low clinical suspicion for significant intracranial pathology. The headache was not sudden onset, not maximal at onset, there are no neurologic findings on exam, the patient does not have a fever, the patient does not have any jaw claudication, the patient does not endorse a clotting disorder, patient denies any trauma or eye pain and the headache is not associated with dizziness, weakness on one side of the body, diplopia, vertigo, slurred speech, or ataxia. Given the extremely low risk of these diagnoses further testing and evaluation for these possibilities does not appear to be indicated at this time.   The patient improved significantly and was discharged in stable condition. Detailed discussions were had with the patient regarding current findings, and need for close f/u with PCP or on call doctor. The patient has been instructed to return immediately if the symptoms worsen in any way for re-evaluation. Patient verbalized understanding and is in agreement with current care plan. All questions answered prior to  discharge.  Procedures  Final Clinical Impressions(s) / ED Diagnoses     ICD-10-CM   1. Bad headache  R51.9     2. Sinusitis, unspecified chronicity, unspecified location  J32.9       ED Discharge Orders          Ordered    Ambulatory referral to Neurology       Comments: An appointment is requested in approximately: 1 week   08/14/22 1219    promethazine (PHENERGAN) 25 MG tablet  Every 6 hours PRN        08/14/22 1220    oxymetazoline (AFRIN NASAL SPRAY) 0.05 % nasal spray  2 times daily        08/14/22 1220    loratadine (CLARITIN) 10 MG tablet  Daily        08/14/22 1220              Discharge Instructions      It was a pleasure caring for you today in the emergency department.  Your CAT scan and MRI are stable, does show sinusitis but no evidence of stroke or bleeding in your brain  Please follow-up with neurology regards to your headaches  Please return to the emergency department for any worsening or worrisome symptoms.          Sloan Leiter, DO 08/14/22 1223

## 2022-08-14 NOTE — ED Provider Notes (Signed)
[place] EMERGENCY DEPARTMENT AT [facility] Provider Note   CSN: [id number] Arrival date & time: 08/14/22  0175     History  Chief Complaint  Patient presents with   Headache   Eye Pain    Karla Villa is a 37 y.o. female.  The history is provided by the patient.  Headache Associated symptoms: eye pain   Eye Pain Associated symptoms include headaches.  She has history of hypertension, asthma, coronary artery disease, polycystic ovarian syndrome, migraines and comes in with a right retro-orbital headache for the last 3 days.  She took her usual migraine medication, but it has not helped.  She describes a throbbing pain which is worse with exposure to light.  There is associated nausea and vomiting.  There has been intermittent blurring of her vision.  She states she did have a fever 3 days ago as high as 102 degrees, but has not had a fever since then.  She is currently taking amoxicillin for a sinus infection and a dental infection.  She denies weakness, numbness, tingling.   Home Medications Prior to Admission medications   Medication Sig Start Date End Date Taking? Authorizing Provider  albuterol (PROVENTIL HFA;VENTOLIN HFA) 108 (90 BASE) MCG/ACT inhaler Inhale into the lungs every 6 (six) hours as needed for wheezing or shortness of breath.    [provider]  ALPRAZolam Prudy Feeler) 0.5 MG tablet Take 0.5 mg by mouth daily as needed for anxiety. 04/20/19   [provider]  busPIRone (BUSPAR) 10 MG tablet Take 1 tablet (10 mg total) by mouth 2 (two) times daily. 09/22/19   Helane Rima, DO  fluticasone Aleda Grana) 50 MCG/ACT nasal spray  06/04/19   [provider]  ibuprofen (ADVIL) 200 MG tablet Take 800 mg by mouth every 6 (six) hours as needed for fever, headache or moderate pain.     [provider]  Insulin Pen Needle (BD PEN NEEDLE NANO 2ND GEN) 32G X 4 MM MISC Use 1 needle with Saxenda pen subcutaneously daily. 09/22/19    Helane Rima, DO  medroxyPROGESTERone (PROVERA) 5 MG tablet Take 5 mg by mouth daily. 08/09/19   [provider]  Multiple Vitamins-Minerals (ZINC PO) Take 1 tablet by mouth daily.    [provider]  SAXENDA 18 MG/3ML SOPN INJECT 0.5 MLS INTO SKIN DAILY 10/11/19   Helane Rima, DO  Semaglutide-Weight Management (WEGOVY) 0.5 MG/0.5ML SOAJ Inject 0.5 mL every week by subcutaneous route as directed for 28 days. 12/18/21     sertraline (ZOLOFT) 100 MG tablet Take 1 tablet (100 mg total) by mouth daily. 09/22/19   Helane Rima, DO  sodium chloride (OCEAN) 0.65 % SOLN nasal spray Place 1 spray into both nostrils as needed for congestion.    [provider]  topiramate (TOPAMAX) 50 MG tablet Take 1 tablet (50 mg total) by mouth 2 (two) times daily. 09/22/19   Helane Rima, DO  valACYclovir (VALTREX) 1000 MG tablet Take 1,000 mg by mouth daily. 08/03/19   [provider]  Vitamin D, Ergocalciferol, (DRISDOL) 1.25 MG (50000 UNIT) CAPS capsule Take 1 capsule (50,000 Units total) by mouth every 3 (three) days. 09/01/19   Helane Rima, DO      Allergies    Doxycycline and Lamisil [terbinafine hcl]    Review of Systems   Review of Systems  Eyes:  Positive for pain.  Neurological:  Positive for headaches.  All other systems reviewed and are negative.   Physical Exam Updated Vital  Signs BP (!) 174/107   Pulse 100   Temp 98.4 F (36.9 C)   Resp 18   SpO2 100%  Physical Exam Vitals and nursing note reviewed.   38 year old female, resting comfortably and in no acute distress. Vital signs are significant for elevated blood pressure. Oxygen saturation is 100%, which is normal. Head is normocephalic and atraumatic. PERRLA, EOMI. Photophobia is present on the right.  There is tenderness to palpation over the right frontal and right maxillary sinuses.  There is no nasal drainage, no edema of the turbinates.  There is tenderness to palpation over the right temporalis  muscle. Neck is nontender and supple without adenopathy or JVD. Back is nontender and there is no CVA tenderness. Lungs are clear without rales, wheezes, or rhonchi. Chest is nontender. Heart has regular rate and rhythm without murmur. Abdomen is soft, flat, nontender. Extremities have no cyanosis or edema, full range of motion is present. Skin is warm and dry without rash. Neurologic: Mental status is normal, cranial nerves are intact, strength is 5/5 in all 4 extremities.  ED Results / Procedures / Treatments   Labs (all labs ordered are listed, but only abnormal results are displayed) Labs Reviewed - No data to display  EKG None  Radiology No results found.  Procedures Procedures    Medications Ordered in ED Medications  sodium chloride 0.9 % bolus 1,000 mL (1,000 mLs Intravenous New Bag/Given 08/14/22 0524)  prochlorperazine (COMPAZINE) injection 10 mg (10 mg Intravenous Given 08/14/22 0525)  diphenhydrAMINE (BENADRYL) injection 25 mg (25 mg Intravenous Given 08/14/22 0524)  morphine (PF) 4 MG/ML injection 4 mg (4 mg Intravenous Given 08/14/22 0929)  ketorolac (TORADOL) 30 MG/ML injection 30 mg (30 mg Intravenous Given 08/14/22 5747)    ED Course/ Medical Decision Making/ A&P                             Medical Decision Making Amount and/or Complexity of Data Reviewed Radiology: ordered.  Risk Prescription drug management.   Headache consistent with migraine headache.  Consider muscle contraction headache.  Doubt sinus headache.  No red flags to suggest meningitis, brain abscess, aneurysm.  I have ordered a migraine cocktail of normal saline solution, intravenous prochlorperazine and diphenhydramine and patient will be reassessed following that.  She had no relief of her headache with above-noted treatment.  I have ordered a dose of morphine and ketorolac and I have ordered a CT of the head.  Headache is markedly improved following above-noted treatment, but she  notes that she still gets a sharp pain if she coughs.  I have reviewed her CT scan and see evidence of left maxillary sinusitis but no evidence of sinusitis on the right and I do not see any other acute findings - radiologist interpretation is pending.  Case is signed out to Dr. Wallace Cullens.  Final Clinical Impression(s) / ED Diagnoses Final diagnoses:  Bad headache    Rx / DC Orders ED Discharge Orders     None         Dione Booze, MD 08/14/22 [phone]

## 2022-08-14 NOTE — ED Triage Notes (Addendum)
Patient arrived stating she has right eye pain that radiates to the back of her head over the last few days. States she has had photosensitivity and some dizziness. Also currently taking antibiotics for an infection in her gum. Hx of migraines but states this feels different.

## 2022-08-14 NOTE — Discharge Instructions (Addendum)
It was a pleasure caring for you today in the emergency department.  Your CAT scan and MRI are stable, does show sinusitis but no evidence of stroke or bleeding in your brain  Please follow-up with neurology regards to your headaches  Please return to the emergency department for any worsening or worrisome symptoms.

## 2023-07-03 ENCOUNTER — Other Ambulatory Visit: Payer: Self-pay

## 2023-07-03 ENCOUNTER — Encounter: Payer: Self-pay | Admitting: Allergy & Immunology

## 2023-07-03 ENCOUNTER — Ambulatory Visit: Payer: 59 | Admitting: Allergy & Immunology

## 2023-07-03 VITALS — BP 146/70 | HR 106 | Temp 98.7°F | Resp 16 | Ht 63.39 in | Wt 264.4 lb

## 2023-07-03 DIAGNOSIS — J310 Chronic rhinitis: Secondary | ICD-10-CM

## 2023-07-03 DIAGNOSIS — J4530 Mild persistent asthma, uncomplicated: Secondary | ICD-10-CM

## 2023-07-03 DIAGNOSIS — L239 Allergic contact dermatitis, unspecified cause: Secondary | ICD-10-CM | POA: Diagnosis not present

## 2023-07-03 DIAGNOSIS — L2089 Other atopic dermatitis: Secondary | ICD-10-CM

## 2023-07-03 MED ORDER — FLUTICASONE FUROATE-VILANTEROL 100-25 MCG/ACT IN AEPB: 1.0000 | INHALATION_SPRAY | Freq: Every day | RESPIRATORY_TRACT | 5 refills | Status: AC

## 2023-07-03 MED ORDER — TACROLIMUS 0.1 % EX OINT: TOPICAL_OINTMENT | Freq: Two times a day (BID) | CUTANEOUS | 3 refills | Status: DC

## 2023-07-03 NOTE — Progress Notes (Signed)
 NEW PATIENT  Date of Service/Encounter:  07/03/23  Consult requested by: [facility]   Assessment:   Chronic rhinitis - planning for skin testing  Flexural atopic dermatitis - with features consistent with psoriasis as well (distribution of the rash on the extensor surfaces of the elbow)  Mild persistent asthma, uncomplicated  Allergic contact dermatitis - planning for patch testing  Plan/Recommendations:   1. Flexural atopic dermatitis - Add on Protopic twice daily (safe to use on the face). - Continue with betamethasone as needed.  - We will refer you for a second opinion for a Dermatologist.   2. Chronic rhinitis (Primary) - Because of insurance stipulations, we cannot do skin testing on the same day as your first visit. - We are all working to fight this, but for now we need to do two separate visits.  - We will know more after we do testing at the next visit.  - The skin testing visit can be squeezed in at your convenience.  - Then we can make a more full plan to address all of your symptoms. - Be sure to stop your antihistamines for 3 days before this appointment.   3. Mild persistent asthma, uncomplicated - Lung testing looks great today. - With the increased use of the albuterol, I think it is worth it to try something daily to see if it helps. - Daily controller medication(s): Breo 100/65mcg one puff once daily - Prior to physical activity: albuterol 2 puffs 10-15 minutes before physical activity. - Rescue medications: albuterol 4 puffs every 4-6 hours as needed - Asthma control goals:  * Full participation in all desired activities (may need albuterol before activity) * Albuterol use two time or less a week on average (not counting use with activity) * Cough interfering with sleep two time or less a month * Oral steroids no more than once a year * No hospitalizations   4. Allergic contact dermatitis - I am going to schedule  you for patch testing to look for chemical sensitivities.   5. Return in about 1 week (around 07/10/2023) for SKIN TESTING (1-55).Marland Kitchen You can have the follow up appointment with Dr. Dellis Anes or a Nurse Practicioner (our Nurse Practitioners are excellent and always have Physician oversight!).    This note in its entirety was forwarded to the Provider who requested this consultation.  Subjective:   Karla Villa is a 38 y.o. female presenting today for evaluation of  Chief Complaint  Patient presents with   Asthma   Allergies   Eczema    Karla Villa has a history of the following: Patient Active Problem List   Diagnosis Date Noted   Mixed hyperlipidemia 09/22/2019   PCOS (polycystic ovarian syndrome) 09/01/2019   Excessive daytime sleepiness 09/01/2019   Non-restorative sleep 09/01/2019   Loud snoring 09/01/2019   High hematocrit without dehydration 09/01/2019   Class 3 severe obesity due to excess calories without serious comorbidity with body mass index (BMI) of 40.0 to 44.9 in adult (HCC) 09/01/2019   History of 2019 novel coronavirus disease (COVID-19) 08/09/2019   Insulin resistance 08/09/2019   Vitamin D deficiency 08/09/2019    History obtained from: chart review and patient.  Discussed the use of AI scribe software for clinical note transcription with the patient and/or guardian, who gave verbal consent to proceed.  Karla Villa was referred by [facility].     Karla Villa is a 38 y.o. female  presenting for an evaluation of eczema as well as asthma and environmental allergies .   Asthma/Respiratory Symptom History: She has a history of asthma, which she manages with an albuterol inhaler. Recently, she has been using the inhaler more frequently, attributing it to weather changes. Asthma developed in adulthood, and she previously used Advair a couple of years ago but stopped after discontinuing visits with her primary doctor. She  has been using her albuterol more frequently as of late. She does not get prednisone or go to the ED for her symptoms frequently.   Allergic Rhinitis Symptom History: She also has a history of allergies, managed with prescription and over-the-counter medications. Her allergies have been present since adulthood, with symptoms worsening with weather changes. She reports frequent earaches but does not regularly seek treatment for them. Change of seasons is the worst time of the year for her. She does not get antibiotics at all, although she doesn ot like to seek medical care either.   Skin Symptom History: She has experienced severe eczema for years, with a recent exacerbation over the past eight months. The eczema primarily affects her hands and has recently spread to her eyes, causing significant swelling and flaking. The flares are severe and persistent, despite years of intermittent treatment. Last week, she received hydrocortisone injections to manage swelling and itching, which provided relief for about four days. She uses betamethasone cream nightly, but the eczema returns immediately if she stops. She has tried various treatments, including clobetasol and nonsteroidal ointments like Elidel and Protopic in the past. No history of eczema on the eyes until recently. She has never had a biopy performed. She sees [employer] every couple of years with [facility].   She lives with her family in the [place] area. Her family history includes a brother who is a Engineer, civil (consulting) and a cousin who is a PA.   Otherwise, there is no history of other atopic diseases, including drug allergies, stinging insect allergies, or contact dermatitis. There is no significant infectious history. Vaccinations are up to date.    Past Medical History: Patient Active Problem List   Diagnosis Date Noted   Mixed hyperlipidemia 09/22/2019   PCOS (polycystic ovarian syndrome) 09/01/2019   Excessive daytime sleepiness 09/01/2019    Non-restorative sleep 09/01/2019   Loud snoring 09/01/2019   High hematocrit without dehydration 09/01/2019   Class 3 severe obesity due to excess calories without serious comorbidity with body mass index (BMI) of 40.0 to 44.9 in adult [facility]) 09/01/2019   History of 2019 novel coronavirus disease (COVID-19) 08/09/2019   Insulin resistance 08/09/2019   Vitamin D deficiency 08/09/2019    Medication List:  Allergies as of 07/03/2023       Reactions   Doxycycline Nausea And Vomiting   Lamisil [terbinafine Hcl] Other (See Comments)   Headaches        Medication List        Accurate as of July 03, 2023  9:16 AM. If you have any questions, ask your nurse or doctor.          albuterol 108 (90 Base) MCG/ACT inhaler Commonly known as: VENTOLIN HFA Inhale into the lungs every 6 (six) hours as needed for wheezing or shortness of breath.   ALPRAZolam 0.5 MG tablet Commonly known as: XANAX Take 0.5 mg by mouth daily as needed for anxiety.   BD Pen Needle Nano 2nd Gen 32G X 4 MM Misc Generic drug: Insulin Pen Needle Use 1 needle with Saxenda pen subcutaneously  daily.   busPIRone 10 MG tablet Commonly known as: BUSPAR Take 1 tablet (10 mg total) by mouth 2 (two) times daily.   fluticasone 50 MCG/ACT nasal spray Commonly known as: FLONASE   fluticasone furoate-vilanterol 100-25 MCG/ACT Aepb Commonly known as: Breo Ellipta Inhale 1 puff into the lungs daily. Started by: Alfonse Spruce   ibuprofen 200 MG tablet Commonly known as: ADVIL Take 800 mg by mouth every 6 (six) hours as needed for fever, headache or moderate pain.   loratadine 10 MG tablet Commonly known as: CLARITIN Take 1 tablet (10 mg total) by mouth daily for 14 days.   medroxyPROGESTERone 5 MG tablet Commonly known as: PROVERA Take 5 mg by mouth daily.   montelukast 10 MG tablet Commonly known as: SINGULAIR Take 10 mg by mouth daily.   promethazine 25 MG tablet Commonly known as:  PHENERGAN Take 1 tablet (25 mg total) by mouth every 6 (six) hours as needed for nausea or vomiting.   Saxenda 18 MG/3ML Sopn Generic drug: Liraglutide -Weight Management INJECT 0.5 MLS INTO SKIN DAILY   sertraline 100 MG tablet Commonly known as: ZOLOFT Take 1 tablet (100 mg total) by mouth daily.   sodium chloride 0.65 % Soln nasal spray Commonly known as: OCEAN Place 1 spray into both nostrils as needed for congestion.   tacrolimus 0.1 % ointment Commonly known as: PROTOPIC Apply topically 2 (two) times daily. OK to use on the face. Started by: Alfonse Spruce   topiramate 50 MG tablet Commonly known as: Topamax Take 1 tablet (50 mg total) by mouth 2 (two) times daily.   valACYclovir 1000 MG tablet Commonly known as: VALTREX Take 1,000 mg by mouth daily.   Vitamin D (Ergocalciferol) 1.25 MG (50000 UNIT) Caps capsule Commonly known as: DRISDOL Take 1 capsule (50,000 Units total) by mouth every 3 (three) days.   Wegovy 0.5 MG/0.5ML Soaj Generic drug: Semaglutide-Weight Management Inject 0.5 mL every week by subcutaneous route as directed for 28 days.   ZINC PO Take 1 tablet by mouth daily.        Birth History: non-contributory  Developmental History: non-contributory  Past Surgical History: Past Surgical History:  Procedure Laterality Date   TONSILLECTOMY     WISDOM TOOTH EXTRACTION       Family History: Family History  Problem Relation Age of Onset   Allergic rhinitis Mother    Asthma Sister    Asthma Brother    Angioedema Neg Hx    Eczema Neg Hx    Urticaria Neg Hx      Social History: Keigan lives at home with her family.  She lives in a house that is 36 years old.  There is hardwood in the main living areas and carpeting in the bedroom.  She has gas heating and central cooling.  There are cats, dogs, and fish inside of the home.  There is no tobacco exposure.  She currently works as a Clinical biochemist for the past 9 years.  There is exposure to fumes,  chemicals, and dust.  [place] or [place] 2 [place] away.   Review of systems otherwise negative other than that mentioned in the HPI.    Objective:   Blood pressure (!) 146/70, pulse (!) 106, temperature 98.7 F (37.1 C), temperature source Temporal, resp. rate 16, height 5' 3.39" (1.61 m), weight 264 lb 6.4 oz (119.9 kg), SpO2 98%. Body mass index is 46.27 kg/m.     Physical Exam  Vitals reviewed.  Constitutional:      Appearance: She is well-developed.  HENT:     Head: Normocephalic and atraumatic.     Right Ear: Tympanic membrane, ear canal and external ear normal. No drainage, swelling or tenderness. Tympanic membrane is not injected, scarred, erythematous, retracted or bulging.     Left Ear: Tympanic membrane, ear canal and external ear normal. No drainage, swelling or tenderness. Tympanic membrane is not injected, scarred, erythematous, retracted or bulging.     Nose: No nasal deformity, septal deviation, mucosal edema or rhinorrhea.     Right Turbinates: Enlarged, swollen and pale.     Left Turbinates: Enlarged, swollen and pale.     Right Sinus: No maxillary sinus tenderness or frontal sinus tenderness.     Left Sinus: No maxillary sinus tenderness or frontal sinus tenderness.     Mouth/Throat:     Mouth: Mucous membranes are not pale and not dry.     Pharynx: Uvula midline.  Eyes:     General: Lids are normal. No allergic shiner.       Right eye: No discharge.        Left eye: No discharge.     Conjunctiva/sclera: Conjunctivae normal.     Right eye: Right conjunctiva is not injected. No chemosis.    Left eye: Left conjunctiva is not injected. No chemosis.    Pupils: Pupils are equal, round, and reactive to light.  Cardiovascular:     Rate and Rhythm: Normal rate and regular rhythm.     Heart sounds: Normal heart sounds.  Pulmonary:     Effort: Pulmonary effort is normal. No tachypnea, accessory muscle usage or respiratory  distress.     Breath sounds: Normal breath sounds. No wheezing, rhonchi or rales.     Comments: Very pleasant. Moving air well in all lung fields. No increased work of breathing noted.  Chest:     Chest wall: No tenderness.  Abdominal:     Tenderness: There is no abdominal tenderness. There is no guarding or rebound.  Lymphadenopathy:     Head:     Right side of head: No submandibular, tonsillar or occipital adenopathy.     Left side of head: No submandibular, tonsillar or occipital adenopathy.     Cervical: No cervical adenopathy.  Skin:    Coloration: Skin is not pale.     Findings: Rash present. No abrasion, erythema, lesion or petechiae. Rash is not papular, urticarial or vesicular.     Comments: Roughened rashes on the bilateral hands. She has some peeling noted and hyperkeratotic lesions. She also has some lesions on the extensor surfaces of her arms, although there is no silver hue. She has noted a silvery hue in the past and when I look up psoriasis on the Google images, she does confirm that her rashes do look like that occasionally.   Neurological:     Mental Status: She is alert.  Psychiatric:        Behavior: Behavior is cooperative.      Diagnostic studies:    Spirometry: results normal (FEV1: 3.35/110%, FVC: 4.75/129%, FEV1/FVC: 71%).    Spirometry consistent with normal pattern.    Allergy Studies: deferred due to insurance stipulations that require a separate visit for testing       Malachi Bonds, MD [facility]

## 2023-07-03 NOTE — Patient Instructions (Addendum)
 1. Flexural atopic dermatitis - Add on Protopic twice daily (safe to use on the face). - Continue with betamethasone as needed.  - We will refer you for a second opinion for a Dermatologist.   2. Chronic rhinitis (Primary) - Because of insurance stipulations, we cannot do skin testing on the same day as your first visit. - We are all working to fight this, but for now we need to do two separate visits.  - We will know more after we do testing at the next visit.  - The skin testing visit can be squeezed in at your convenience.  - Then we can make a more full plan to address all of your symptoms. - Be sure to stop your antihistamines for 3 days before this appointment.   3. Mild persistent asthma, uncomplicated - Lung testing looks great today. - With the increased use of the albuterol, I think it is worth it to try something daily to see if it helps. - Daily controller medication(s): Breo 100/56mcg one puff once daily - Prior to physical activity: albuterol 2 puffs 10-15 minutes before physical activity. - Rescue medications: albuterol 4 puffs every 4-6 hours as needed - Asthma control goals:  * Full participation in all desired activities (may need albuterol before activity) * Albuterol use two time or less a week on average (not counting use with activity) * Cough interfering with sleep two time or less a month * Oral steroids no more than once a year * No hospitalizations   4. Allergic contact dermatitis - I am going to schedule you for patch testing to look for chemical sensitivities.   5. Return in about 1 week (around 07/10/2023) for SKIN TESTING (1-55).Marland Kitchen You can have the follow up appointment with Dr. Dellis Anes or a Nurse Practicioner (our Nurse Practitioners are excellent and always have Physician oversight!).    Please inform us of any Emergency Department visits, hospitalizations, or changes in symptoms. Call us before going to the ED for breathing or allergy symptoms since we  might be able to fit you in for a sick visit. Feel free to contact us anytime with any questions, problems, or concerns.  It was a pleasure to meet you today!  Websites that have reliable patient information: 1. American Academy of Asthma, Allergy, and Immunology: www.aaaai.org 2. Food Allergy Research and Education (FARE): foodallergy.org 3. Mothers of Asthmatics: http://www.asthmacommunitynetwork.org 4. American College of Allergy, Asthma, and Immunology: www.acaai.org      "Like" [place] on Facebook and Instagram for our latest updates!      A healthy democracy works best when [employer] participate! Make sure you are registered to vote! If you have moved or changed any of your contact information, you will need to get this updated before voting! Scan the QR codes below to learn more!

## 2023-07-10 ENCOUNTER — Ambulatory Visit: Payer: 59 | Admitting: Allergy & Immunology

## 2023-07-10 ENCOUNTER — Encounter: Payer: Self-pay | Admitting: Allergy & Immunology

## 2023-07-10 DIAGNOSIS — L409 Psoriasis, unspecified: Secondary | ICD-10-CM

## 2023-07-10 DIAGNOSIS — J302 Other seasonal allergic rhinitis: Secondary | ICD-10-CM

## 2023-07-10 DIAGNOSIS — L2089 Other atopic dermatitis: Secondary | ICD-10-CM

## 2023-07-10 DIAGNOSIS — J4530 Mild persistent asthma, uncomplicated: Secondary | ICD-10-CM

## 2023-07-10 DIAGNOSIS — J3089 Other allergic rhinitis: Secondary | ICD-10-CM

## 2023-07-10 MED ORDER — LEVOCETIRIZINE DIHYDROCHLORIDE 5 MG PO TABS: 5.0000 mg | ORAL_TABLET | Freq: Two times a day (BID) | ORAL | 1 refills | Status: DC

## 2023-07-10 MED ORDER — MONTELUKAST SODIUM 10 MG PO TABS: 10.0000 mg | ORAL_TABLET | Freq: Every day | ORAL | 1 refills | Status: DC

## 2023-07-10 NOTE — Progress Notes (Signed)
 FOLLOW UP  Date of Service/Encounter:  07/10/23   Assessment:   Perennial and seasonal allergic rhinitis (grasses, ragweed, weeds, trees, indoor molds, dust mites, dog, and cockroach)  Flexural atopic dermatitis - with features consistent with psoriasis as well (distribution of the rash on the extensor surfaces of the elbow)   Mild persistent asthma, uncomplicated   Allergic contact dermatitis - planning for patch testing  Plan/Recommendations:   1. Flexural atopic dermatitis - Let us know how the Protopic twice daily works!  - Continue with betamethasone as needed.  - We will follow up on the Dermatology Referral.   2. Chronic rhinitis - Testing today showed: grasses, ragweed, weeds, trees, indoor molds, dust mites, dog, and cockroach - Copy of test results provided.  - Avoidance measures provided. - Stop taking: current antihistamines - Continue with: Singulair (montelukast) 10mg  daily  - Start taking: Xyzal (levocetirizine) 5mg  tablet TWICE daily - You can use an extra dose of the antihistamine, if needed, for breakthrough symptoms.  - Consider nasal saline rinses 1-2 times daily to remove allergens from the nasal cavities as well as help with mucous clearance (this is especially helpful to do before the nasal sprays are given) - Strongly consider allergy shots as a means of long-term control. - Allergy shots "re-train" and "reset" the immune system to ignore environmental allergens and decrease the resulting immune response to those allergens (sneezing, itchy watery eyes, runny nose, nasal congestion, etc).    - Allergy shots improve symptoms in 75-85% of patients.  - We can discuss more at the next appointment if the medications are not working for you.  3. Mild persistent asthma, uncomplicated - Lung testing not done, but I am glad that the Breo seems to be helping. - Daily controller medication(s): Breo 100/42mcg one puff once daily - Prior to physical activity:  albuterol 2 puffs 10-15 minutes before physical activity. - Rescue medications: albuterol 4 puffs every 4-6 hours as needed - Asthma control goals:  * Full participation in all desired activities (may need albuterol before activity) * Albuterol use two time or less a week on average (not counting use with activity) * Cough interfering with sleep two time or less a month * Oral steroids no more than once a year * No hospitalizations   4. Allergic contact dermatitis - Be sure to schedule patch testing to look for chemical sensitivities.  - I would recommend bringing her betamethasone in case you develop an allergy to that. - We could certainly go through the process of starting something like Dupixent or Ebglyss, but I want to make sure that we have the right diagnosis first. - So many of the characteristics, including the distribution, is more consistent with psoriasis.  5. Return in about 4 days (around 07/14/2023) [facility]. You can have the follow up appointment with Dr. Dellis Anes or a Nurse Practicioner (our Nurse Practitioners are excellent and always have Physician oversight!).    Subjective:   Karla Villa is a 38 y.o. female presenting today for follow up of No chief complaint on file.   Karla Villa has a history of the following: Patient Active Problem List   Diagnosis Date Noted   Mixed hyperlipidemia 09/22/2019   PCOS (polycystic ovarian syndrome) 09/01/2019   Excessive daytime sleepiness 09/01/2019   Non-restorative sleep 09/01/2019   Loud snoring 09/01/2019   High hematocrit without dehydration 09/01/2019   Class 3 severe obesity due to excess calories without serious comorbidity with body mass  index (BMI) of 40.0 to 44.9 in adult [facility]) 09/01/2019   History of 2019 novel coronavirus disease (COVID-19) 08/09/2019   Insulin resistance 08/09/2019   Vitamin D deficiency 08/09/2019    History obtained from: chart review and patient.  Discussed the use  of AI scribe software for clinical note transcription with the patient and/or guardian, who gave verbal consent to proceed.  Karla Villa is a 38 y.o. female presenting for skin testing. She was last seen on February 27. We could not do testing because her insurance company does not cover testing on the same day as a New Patient visit. She has been off of all antihistamines 3 days in anticipation of the testing.   At that time, we decided to do environmental allergy testing due to her history of chronic rhinitis.  For her atopic dermatitis, we added on Protopic twice daily and continue with betamethasone as needed.  We did refer her to dermatology for a second opinion.  Her asthma was not under good control as she has been using her albuterol more frequently.  We started Breo 100 mcg 1 puff once daily.  Today, she does tell me that the Virgel Bouquet has helped to improve her shortness of breath episodes and decrease her use of her albuterol.  This seems to be working well for her.  It was only $20.  She has not received the tacrolimus, but is going to pick it up today.  It was only $25.  She has not heard from dermatology yet.  Otherwise, there have been no changes to her past medical history, surgical history, family history, or social history.    Review of systems otherwise negative other than that mentioned in the HPI.    Objective:   There were no vitals taken for this visit. There is no height or weight on file to calculate BMI.    Physical exam deferred since this was a skin testing appointment only.   Diagnostic studies:   Allergy Studies:     Airborne Adult Perc - 07/10/23 0909     Time Antigen Placed 0909    Allergen Manufacturer Waynette Buttery    Location Back    Number of Test 55    1. Control-Buffer 50% Glycerol Negative    2. Control-Histamine 2+    3. Bahia Negative    4. [place] Negative    5. Johnson Negative    6. Kentucky Blue Negative    7. Meadow Fescue Negative    8. Perennial  Rye Negative    9. Timothy Negative    10. Ragweed Mix Negative    11. Cocklebur Negative    12. Plantain,  English Negative    13. Baccharis Negative    14. Dog Fennel Negative    15. Russian Thistle Negative    16. Lamb's Quarters Negative    17. Sheep Sorrell Negative    18. Rough Pigweed 2+    19. Marsh Elder, Rough 2+    20. Mugwort, Common Negative    21. Box, Elder Negative    22. Cedar, red 2+    23. Sweet Gum 2+    24. Pecan Pollen Negative    25. Pine Mix Negative    26. Walnut, Black Pollen Negative    27. Red Mulberry Negative    28. Ash Mix Negative    29. Birch Mix Negative    30. Beech American Negative    31. Cottonwood, [place] Negative    32. Hickory, White Negative  33. Maple Mix 2+    34. Oak, [place] Mix Negative    35. Sycamore Eastern 3+    36. Alternaria Alternata Negative    37. Cladosporium Herbarum Negative    38. Aspergillus Mix 3+    39. Penicillium Mix Negative    40. Bipolaris Sorokiniana (Helminthosporium) Negative    41. Drechslera Spicifera (Curvularia) Negative    42. Mucor Plumbeus Negative    43. Fusarium Moniliforme Negative    44. Aureobasidium Pullulans (pullulara) Negative    45. Rhizopus Oryzae Negative    46. Botrytis Cinera Negative    47. Epicoccum Nigrum Negative    48. Phoma Betae Negative    49. Dust Mite Mix 4+    50. Cat Hair 10,000 BAU/ml Negative    51.  Dog Epithelia Negative    52. Mixed Feathers Negative    53. Horse Epithelia Negative    54. Cockroach, German 3+    55. Tobacco Leaf Negative             Intradermal - 07/10/23 0955     Time Antigen Placed 0940    Allergen Manufacturer Waynette Buttery    Location Arm    Number of Test 10    Control Negative    Bahia 3+    [place] 3+    Johnson Negative    7 Grass Negative    Ragweed Mix 4+    Mold 1 Negative    Mold 3 Negative    Mold 4 Negative    Cat Negative    Dog 3+             Allergy testing results were read and interpreted by myself,  documented by clinical staff.      Malachi Bonds, MD  [facility]

## 2023-07-10 NOTE — Patient Instructions (Addendum)
 1. Flexural atopic dermatitis - Let us know how the Protopic twice daily works!  - Continue with betamethasone as needed.  - We will follow up on the Dermatology Referral.   2. Chronic rhinitis - Testing today showed: grasses, ragweed, weeds, trees, indoor molds, dust mites, dog, and cockroach - Copy of test results provided.  - Avoidance measures provided. - Stop taking: current antihistamines - Continue with: Singulair (montelukast) 10mg  daily  - Start taking: Xyzal (levocetirizine) 5mg  tablet TWICE daily - You can use an extra dose of the antihistamine, if needed, for breakthrough symptoms.  - Consider nasal saline rinses 1-2 times daily to remove allergens from the nasal cavities as well as help with mucous clearance (this is especially helpful to do before the nasal sprays are given) - Strongly consider allergy shots as a means of long-term control. - Allergy shots "re-train" and "reset" the immune system to ignore environmental allergens and decrease the resulting immune response to those allergens (sneezing, itchy watery eyes, runny nose, nasal congestion, etc).    - Allergy shots improve symptoms in 75-85% of patients.  - We can discuss more at the next appointment if the medications are not working for you.  3. Mild persistent asthma, uncomplicated - Lung testing not done, but I am glad that the Breo seems to be helping. - Daily controller medication(s): Breo 100/66mcg one puff once daily - Prior to physical activity: albuterol 2 puffs 10-15 minutes before physical activity. - Rescue medications: albuterol 4 puffs every 4-6 hours as needed - Asthma control goals:  * Full participation in all desired activities (may need albuterol before activity) * Albuterol use two time or less a week on average (not counting use with activity) * Cough interfering with sleep two time or less a month * Oral steroids no more than once a year * No hospitalizations   4. Allergic contact  dermatitis - Be sure to schedule patch testing to look for chemical sensitivities.   5. Return in about 4 days (around 07/14/2023) for [facility] TESTING. You can have the follow up appointment with Dr. Dellis Anes or a Nurse Practicioner (our Nurse Practitioners are excellent and always have Physician oversight!).    Please inform us of any Emergency Department visits, hospitalizations, or changes in symptoms. Call us before going to the ED for breathing or allergy symptoms since we might be able to fit you in for a sick visit. Feel free to contact us anytime with any questions, problems, or concerns.  It was a pleasure to see you again today!  Websites that have reliable patient information: 1. American Academy of Asthma, Allergy, and Immunology: www.aaaai.org 2. Food Allergy Research and Education (FARE): foodallergy.org 3. Mothers of Asthmatics: http://www.asthmacommunitynetwork.org 4. American College of Allergy, Asthma, and Immunology: www.acaai.org      "Like" [place] on Facebook and Instagram for our latest updates!      A healthy democracy works best when [employer] participate! Make sure you are registered to vote! If you have moved or changed any of your contact information, you will need to get this updated before voting! Scan the QR codes below to learn more!       True Test looks for the following sensitivities:       Airborne Adult Perc - 07/10/23 0909     Time Antigen Placed 8841    Allergen Manufacturer Waynette Buttery    Location Back    Number of Test 55    1. Control-Buffer 50% Glycerol Negative  2. Control-Histamine 2+    3. Bahia Negative    4. [place] Negative    5. Johnson Negative    6. Kentucky Blue Negative    7. Meadow Fescue Negative    8. Perennial Rye Negative    9. Timothy Negative    10. Ragweed Mix Negative    11. Cocklebur Negative    12. Plantain,  English Negative    13. Baccharis Negative    14. Dog Fennel Negative    15. Russian Thistle  Negative    16. Lamb's Quarters Negative    17. Sheep Sorrell Negative    18. Rough Pigweed 2+    19. Marsh Elder, Rough 2+    20. Mugwort, Common Negative    21. Box, Elder Negative    22. Cedar, red 2+    23. Sweet Gum 2+    24. Pecan Pollen Negative    25. Pine Mix Negative    26. Walnut, Black Pollen Negative    27. Red Mulberry Negative    28. Ash Mix Negative    29. Birch Mix Negative    30. Beech American Negative    31. Cottonwood, [place] Negative    32. Hickory, White Negative    33. Maple Mix 2+    34. Oak, [place] Mix Negative    35. Sycamore Eastern 3+    36. Alternaria Alternata Negative    37. Cladosporium Herbarum Negative    38. Aspergillus Mix 3+    39. Penicillium Mix Negative    40. Bipolaris Sorokiniana (Helminthosporium) Negative    41. Drechslera Spicifera (Curvularia) Negative    42. Mucor Plumbeus Negative    43. Fusarium Moniliforme Negative    44. Aureobasidium Pullulans (pullulara) Negative    45. Rhizopus Oryzae Negative    46. Botrytis Cinera Negative    47. Epicoccum Nigrum Negative    48. Phoma Betae Negative    49. Dust Mite Mix 4+    50. Cat Hair 10,000 BAU/ml Negative    51.  Dog Epithelia Negative    52. Mixed Feathers Negative    53. Horse Epithelia Negative    54. Cockroach, German 3+    55. Tobacco Leaf Negative             Intradermal - 07/10/23 0955     Time Antigen Placed 0940    Allergen Manufacturer Waynette Buttery    Location Arm    Number of Test 10    Control Negative    Bahia 3+    [place] 3+    Johnson Negative    7 Grass Negative    Ragweed Mix 4+    Mold 1 Negative    Mold 3 Negative    Mold 4 Negative    Cat Negative    Dog 3+             Reducing Pollen Exposure  The American Academy of Allergy, Asthma and Immunology suggests the following steps to reduce your exposure to pollen during allergy seasons.    Do not hang sheets or clothing out to dry; pollen may collect on these items. Do not mow lawns  or spend time around freshly cut grass; mowing stirs up pollen. Keep windows closed at night.  Keep car windows closed while driving. Minimize morning activities outdoors, a time when pollen counts are usually at their highest. Stay indoors as much as possible when pollen counts or humidity is high and on windy days when pollen tends to  remain in the air longer. Use air conditioning when possible.  Many air conditioners have filters that trap the pollen spores. Use a HEPA room air filter to remove pollen form the indoor air you breathe.  Control of Mold Allergen   Mold and fungi can grow on a variety of surfaces provided certain temperature and moisture conditions exist.  Outdoor molds grow on plants, decaying vegetation and soil.  The major outdoor mold, Alternaria and Cladosporium, are found in very high numbers during hot and dry conditions.  Generally, a late Summer - Fall peak is seen for common outdoor fungal spores.  Rain will temporarily lower outdoor mold spore count, but counts rise rapidly when the rainy period ends.  The most important indoor molds are Aspergillus and Penicillium.  Dark, humid and poorly ventilated basements are ideal sites for mold growth.  The next most common sites of mold growth are the bathroom and the kitchen.  Indoor (Perennial) Mold Control   Positive indoor molds via skin testing: Penicillium  Maintain humidity below 50%. Clean washable surfaces with 5% bleach solution. Remove sources e.g. contaminated carpets.    Control of Dust Mite Allergen    Dust mites play a major role in allergic asthma and rhinitis.  They occur in environments with high humidity wherever human skin is found.  Dust mites absorb humidity from the atmosphere (ie, they do not drink) and feed on organic matter (including shed human and animal skin).  Dust mites are a microscopic type of insect that you cannot see with the naked eye.  High levels of dust mites have been detected from  mattresses, pillows, carpets, upholstered furniture, bed covers, clothes, soft toys and any woven material.  The principal allergen of the dust mite is found in its feces.  A gram of dust may contain 1,000 mites and 250,000 fecal particles.  Mite antigen is easily measured in the air during house cleaning activities.  Dust mites do not bite and do not cause harm to humans, other than by triggering allergies/asthma.    Ways to decrease your exposure to dust mites in your home:  Encase mattresses, box springs and pillows with a mite-impermeable barrier or cover   Wash sheets, blankets and drapes weekly in hot water (130 F) with detergent and dry them in a dryer on the hot setting.  Have the room cleaned frequently with a vacuum cleaner and a damp dust-mop.  For carpeting or rugs, vacuuming with a vacuum cleaner equipped with a high-efficiency particulate air (HEPA) filter.  The dust mite allergic individual should not be in a room which is being cleaned and should wait 1 hour after cleaning before going into the room. Do not sleep on upholstered furniture (eg, couches).   If possible removing carpeting, upholstered furniture and drapery from the home is ideal.  Horizontal blinds should be eliminated in the rooms where the person spends the most time (bedroom, study, television room).  Washable vinyl, roller-type shades are optimal. Remove all non-washable stuffed toys from the bedroom.  Wash stuffed toys weekly like sheets and blankets above.   Reduce indoor humidity to less than 50%.  Inexpensive humidity monitors can be purchased at most hardware stores.  Do not use a humidifier as can make the problem worse and are not recommended.  Control of Dog or Cat Allergen  Avoidance is the best way to manage a dog or cat allergy. If you have a dog or cat and are allergic to dog or cats, consider  removing the dog or cat from the home. If you have a dog or cat but don't want to find it a new home, or if your  family wants a pet even though someone in the household is allergic, here are some strategies that may help keep symptoms at bay:  Keep the pet out of your bedroom and restrict it to only a few rooms. Be advised that keeping the dog or cat in only one room will not limit the allergens to that room. Don't pet, hug or kiss the dog or cat; if you do, wash your hands with soap and water. High-efficiency particulate air (HEPA) cleaners run continuously in a bedroom or living room can reduce allergen levels over time. Regular use of a high-efficiency vacuum cleaner or a central vacuum can reduce allergen levels. Giving your dog or cat a bath at least once a week can reduce airborne allergen.  Control of Cockroach Allergen  Cockroach allergen has been identified as an important cause of acute attacks of asthma, especially in urban settings.  There are fifty-five species of cockroach that exist in the [place], however only three, the [place], [place] species produce allergen that can affect patients with Asthma.  Allergens can be obtained from fecal particles, egg casings and secretions from cockroaches.    Remove food sources. Reduce access to water. Seal access and entry points. Spray runways with 0.5-1% Diazinon or Chlorpyrifos Blow boric acid power under stoves and refrigerator. Place bait stations (hydramethylnon) at feeding sites.  Allergy Shots  Allergies are the result of a chain reaction that starts in the immune system. Your immune system controls how your body defends itself. For instance, if you have an allergy to pollen, your immune system identifies pollen as an invader or allergen. Your immune system overreacts by producing antibodies called Immunoglobulin E (IgE). These antibodies travel to cells that release chemicals, causing an allergic reaction.  The concept behind allergy immunotherapy, whether it is received in the form of shots or tablets, is that the immune  system can be desensitized to specific allergens that trigger allergy symptoms. Although it requires time and patience, the payback can be long-term relief. Allergy injections contain a dilute solution of those substances that you are allergic to based upon your skin testing and allergy history.   How Do Allergy Shots Work?  Allergy shots work much like a vaccine. Your body responds to injected amounts of a particular allergen given in increasing doses, eventually developing a resistance and tolerance to it. Allergy shots can lead to decreased, minimal or no allergy symptoms.  There generally are two phases: build-up and maintenance. Build-up often ranges from three to six months and involves receiving injections with increasing amounts of the allergens. The shots are typically given once or twice a week, though more rapid build-up schedules are sometimes used.  The maintenance phase begins when the most effective dose is reached. This dose is different for each person, depending on how allergic you are and your response to the build-up injections. Once the maintenance dose is reached, there are longer periods between injections, typically two to four weeks.  Occasionally doctors give cortisone-type shots that can temporarily reduce allergy symptoms. These types of shots are different and should not be confused with allergy immunotherapy shots.  Who Can Be Treated with Allergy Shots?  Allergy shots may be a good treatment approach for people with allergic rhinitis (hay fever), allergic asthma, conjunctivitis (eye allergy) or stinging insect allergy.  Before deciding to begin allergy shots, you should consider:   The length of allergy season and the severity of your symptoms  Whether medications and/or changes to your environment can control your symptoms  Your desire to avoid long-term medication use  Time: allergy immunotherapy requires a major time commitment  Cost: may vary depending on  your insurance coverage  Allergy shots for children age 74 and older are effective and often well tolerated. They might prevent the onset of new allergen sensitivities or the progression to asthma.  Allergy shots are not started on patients who are pregnant but can be continued on patients who become pregnant while receiving them. In some patients with other medical conditions or who take certain common medications, allergy shots may be of risk. It is important to mention other medications you talk to your allergist.   What are the two types of build-ups offered:   RUSH or Rapid Desensitization -- one day of injections lasting from 8:30-4:30pm, injections every 1 hour.  Approximately half of the build-up process is completed in that one day.  The following week, normal build-up is resumed, and this entails ~16 visits either weekly or twice weekly, until reaching your "maintenance dose" which is continued weekly until eventually getting spaced out to every month for a duration of 3 to 5 years. The regular build-up appointments are nurse visits where the injections are administered, followed by required monitoring for 30 minutes.    Traditional build-up -- weekly visits for 6 -12 months until reaching "maintenance dose", then continue weekly until eventually spacing out to every 4 weeks as above. At these appointments, the injections are administered, followed by required monitoring for 30 minutes.     Either way is acceptable, and both are equally effective. With the rush protocol, the advantage is that less time is spent here for injections overall AND you would also reach maintenance dosing faster (which is when the clinical benefit starts to become more apparent). Not everyone is a candidate for rapid desensitization.   IF we proceed with the RUSH protocol, there are premedications which must be taken the day before and the day after the rush only (this includes antihistamines, steroids, and  Singulair).  After the rush day, no prednisone or Singulair is required, and we just recommend antihistamines taken on your injection day.  What Is An Estimate of the Costs?  If you are interested in starting allergy injections, please check with your insurance company about your coverage for both allergy vial sets and allergy injections.  Please do so prior to making the appointment to start injections.  The following are CPT codes to give to your insurance company. These are the amounts we BILL to the insurance company, but the amount YOU WILL PAY and WE RECEIVE IS SUBSTANTIALLY LESS and depends on the contracts we have with different insurance companies.   Amount Billed to Insurance Two allergy vial set  CPT 95165   $ 2400     Two injections   CPT 95117   $ 40 RUSH (Rapid Desensitization) CPT 95180 x 8 hours  $500/hour  Regarding the allergy injections, your co-pay may or may not apply with each injection, so please confirm this with your insurance company. When you start allergy injections, 1 or 2 sets of vials are made based on your allergies.  Not all patients can be on one set of vials. A set of vials lasts 6 months to a year depending on how quickly you can proceed  with your build-up of your allergy injections. Vials are personalized for each patient depending on their specific allergens.  How often are allergy injection given during the build-up period?   Injections are given at least weekly during the build-up period until your maintenance dose is achieved. Per the doctor's discretion, you may have the option of getting allergy injections two times per week during the build-up period. However, there must be at least 48 hours between injections. The build-up period is usually completed within 6-12 months depending on your ability to schedule injections and for adjustments for reactions. When maintenance dose is reached, your injection schedule is gradually changed to every two weeks and  later to every three weeks. Injections will then continue every 4 weeks. Usually, injections are continued for a total of 3-5 years.   When Will I Feel Better?  Some may experience decreased allergy symptoms during the build-up phase. For others, it may take as long as 12 months on the maintenance dose. If there is no improvement after a year of maintenance, your allergist will discuss other treatment options with you.  If you aren't responding to allergy shots, it may be because there is not enough dose of the allergen in your vaccine or there are missing allergens that were not identified during your allergy testing. Other reasons could be that there are high levels of the allergen in your environment or major exposure to non-allergic triggers like tobacco smoke.  What Is the Length of Treatment?  Once the maintenance dose is reached, allergy shots are generally continued for three to five years. The decision to stop should be discussed with your allergist at that time. Some people may experience a permanent reduction of allergy symptoms. Others may relapse and a longer course of allergy shots can be considered.  What Are the Possible Reactions?  The two types of adverse reactions that can occur with allergy shots are local and systemic. Common local reactions include very mild redness and swelling at the injection site, which can happen immediately or several hours after. Report a delayed reaction from your last injection. These include arm swelling or runny nose, watery eyes or cough that occurs within 12-24 hours after injection. A systemic reaction, which is less common, affects the entire body or a particular body system. They are usually mild and typically respond quickly to medications. Signs include increased allergy symptoms such as sneezing, a stuffy nose or hives.   Rarely, a serious systemic reaction called anaphylaxis can develop. Symptoms include swelling in the throat, wheezing, a  feeling of tightness in the chest, nausea or dizziness. Most serious systemic reactions develop within 30 minutes of allergy shots. This is why it is strongly recommended you wait in your doctor's office for 30 minutes after your injections. Your allergist is trained to watch for reactions, and his or her staff is trained and equipped with the proper medications to identify and treat them.   Report to the nurse immediately if you experience any of the following symptoms: swelling, itching or redness of the skin, hives, watery eyes/nose, breathing difficulty, excessive sneezing, coughing, stomach pain, diarrhea, or light headedness. These symptoms may occur within 15-20 minutes after injection and may require medication.   Who Should Administer Allergy Shots?  The preferred location for receiving shots is your prescribing allergist's office. Injections can sometimes be given at another facility where the physician and staff are trained to recognize and treat reactions, and have received instructions by your prescribing allergist.  What if I am late for an injection?   Injection dose will be adjusted depending upon how many days or weeks you are late for your injection.   What if I am sick?   Please report any illness to the nurse before receiving injections. She may adjust your dose or postpone injections depending on your symptoms. If you have fever, flu, sinus infection or chest congestion it is best to postpone allergy injections until you are better. Never get an allergy injection if your asthma is causing you problems. If your symptoms persist, seek out medical care to get your health problem under control.  What If I am or Become Pregnant:  Women that become pregnant should schedule an appointment with [facility] before receiving any further allergy injections.

## 2023-07-10 NOTE — Addendum Note (Signed)
 Addended by: Philipp Deputy on: 07/10/2023 01:58 PM   Modules accepted: Orders

## 2023-07-16 ENCOUNTER — Encounter: Payer: Self-pay | Admitting: Allergy & Immunology

## 2023-07-17 ENCOUNTER — Telehealth: Payer: Self-pay | Admitting: Allergy & Immunology

## 2023-07-17 NOTE — Telephone Encounter (Signed)
 Internal referral has been placed to [facility].  Once the referral populates in their WQ, they will reach out to the patient to schedule.  Patient has been made aware.

## 2023-07-18 NOTE — Progress Notes (Signed)
 Follow-up Note  RE: Karla Villa MRN: [id number] DOB: [birth year] Date of Office Visit: 07/21/2023  Primary care provider: Rebecka Apley, NP Referring provider: Rebecka Apley, NP   Karla Villa returns to the office today for the patch test placement, given suspected history of contact dermatitis.    Diagnostics: True Test patches placed.   Plan:   Allergic contact dermatitis - Instructions provided on care of the patches for the next 48 hours. - Karla Villa was instructed to avoid showering for the next 48 hours. Karla Villa will follow up in 48 hours and 96 hours for patch readings.    Call the clinic if this treatment plan is not working well for you  Follow up in 2 days or sooner if needed.

## 2023-07-21 ENCOUNTER — Ambulatory Visit: Admitting: Family Medicine

## 2023-07-21 ENCOUNTER — Other Ambulatory Visit: Payer: Self-pay

## 2023-07-21 ENCOUNTER — Encounter: Payer: Self-pay | Admitting: Family Medicine

## 2023-07-21 VITALS — BP 128/80 | HR 106 | Temp 97.7°F | Resp 17

## 2023-07-21 DIAGNOSIS — L235 Allergic contact dermatitis due to other chemical products: Secondary | ICD-10-CM

## 2023-07-21 DIAGNOSIS — L259 Unspecified contact dermatitis, unspecified cause: Secondary | ICD-10-CM | POA: Insufficient documentation

## 2023-07-21 NOTE — Patient Instructions (Signed)
 Diagnostics: True Test patches placed.   Plan:   Allergic contact dermatitis - Instructions provided on care of the patches for the next 48 hours. - Karla Villa was instructed to avoid showering for the next 48 hours. Karla Villa will follow up in 48 hours and 96 hours for patch readings.    Call the clinic if this treatment plan is not working well for you  Follow up in 2 days or sooner if needed.

## 2023-07-23 ENCOUNTER — Ambulatory Visit (INDEPENDENT_AMBULATORY_CARE_PROVIDER_SITE_OTHER): Admitting: Internal Medicine

## 2023-07-23 DIAGNOSIS — L235 Allergic contact dermatitis due to other chemical products: Secondary | ICD-10-CM

## 2023-07-23 NOTE — Patient Instructions (Addendum)
 Allergic contact dermatitis - Avoid your back from getting wet when showering/bathing.   - The patient has been provided detailed information regarding the substances she is sensitive to, as well as products containing the substances.  Meticulous avoidance of these substances is recommended. If avoidance is not possible, the use of barrier creams or lotions is recommended. - If symptoms persist or progress despite meticulous avoidance of chemicals/substances above, dermatology evaluation may be warranted.  Follow up on Friday.

## 2023-07-23 NOTE — Progress Notes (Signed)
   Follow Up Note  RE: Karla Villa MRN: [id number] DOB: 03-29-1986 Date of Office Visit: 07/23/2023  Referring provider: Rebecka Apley, NP Primary care provider: Rebecka Apley, NP  History of Present Illness: I had the pleasure of seeing Karla Villa for a follow up visit at the [facility] on 07/23/2023. She is a 38 y.o. female, who is being followed for contact dermatitis. Today she is here for initial patch test interpretation, given suspected history of contact dermatitis.   Diagnostics:   TRUE TEST 48-hour hour reading:  1+ reaction to #1 (Nickel Sulfate) and 2+ reaction to #28 (Gold sodium thiosulfate)    Assessment and Plan: Karla Villa is a 38 y.o. female with: Concern for Contact Dermatitis:  The patient has been provided detailed information regarding the substances she is sensitive to, as well as products containing the substances.  Meticulous avoidance of these substances is recommended. If avoidance is not possible, the use of barrier creams or lotions is recommended. If symptoms persist or progress despite meticulous avoidance of chemicals/substances above, dermatology evaluation may be warranted.  Follow up on 3/21  It was my pleasure to see Karla Villa today and participate in her care. Please feel free to contact me with any questions or concerns.  Sincerely,   Alesia Morin, MD [facility]

## 2023-07-25 ENCOUNTER — Ambulatory Visit: Admitting: Internal Medicine

## 2023-07-25 DIAGNOSIS — L235 Allergic contact dermatitis due to other chemical products: Secondary | ICD-10-CM

## 2023-07-25 NOTE — Patient Instructions (Addendum)
 Strictly avoid exposure to Nickel, Gold and Methylchloroisothiazolinone (MCI) and Methylisothiazolinone (MI)  Will send safe product list to email  Follow up:  1) skin test foods with Dr. Dellis Anes 2) 2-3 months

## 2023-07-25 NOTE — Progress Notes (Signed)
   Follow Up Note  RE: Karla Villa MRN: [id number] DOB: 1986-01-17 Date of Office Visit: 07/25/2023  Referring provider: Rebecka Apley, NP Primary care provider: Rebecka Apley, NP  History of Present Illness: I had the pleasure of seeing Karla Villa for a follow up visit at the [facility] on 07/25/2023. She is a 38 y.o. female, who is being followed for contact dermatitis. Today she is here for final patch test interpretation, given suspected history of contact dermatitis.   Diagnostics:    TRUE TEST 96-hour hour reading:  2+ Nickel Sulfate 3+ Gold Sodium Thiosulfate  2+ MCI/MI     Assessment and Plan: Karla Villa is a 38 y.o. female with: Concern for Contact Dermatitis: Strictly avoid exposure to Nickel, Gold and Methylchloroisothiazolinone (MCI) and Methylisothiazolinone (MI) Will send safe product list to email Continue Betamethasone PRN and Protopic BID.   The patient has been provided detailed information regarding the substances she is sensitive to, as well as products containing the substances.  Meticulous avoidance of these substances is recommended. If avoidance is not possible, the use of barrier creams or lotions is recommended. If symptoms persist or progress despite meticulous avoidance of chemicals/substances above, dermatology evaluation may be warranted. Follow up:  1) skin test foods with Dr. Dellis Anes 2) 2-3 months   It was my pleasure to see Karla Villa today and participate in her care. Please feel free to contact me with any questions or concerns.  Sincerely,   Alesia Morin, MD [facility]

## 2023-07-29 ENCOUNTER — Encounter: Payer: Self-pay | Admitting: Allergy & Immunology

## 2023-08-07 ENCOUNTER — Ambulatory Visit: Admitting: Allergy & Immunology

## 2023-08-07 DIAGNOSIS — J4530 Mild persistent asthma, uncomplicated: Secondary | ICD-10-CM

## 2023-08-07 DIAGNOSIS — J302 Other seasonal allergic rhinitis: Secondary | ICD-10-CM

## 2023-08-07 DIAGNOSIS — R1084 Generalized abdominal pain: Secondary | ICD-10-CM | POA: Diagnosis not present

## 2023-08-07 DIAGNOSIS — L2089 Other atopic dermatitis: Secondary | ICD-10-CM

## 2023-08-07 DIAGNOSIS — J3089 Other allergic rhinitis: Secondary | ICD-10-CM

## 2023-08-07 NOTE — Progress Notes (Unsigned)
 FOLLOW UP  Date of Service/Encounter:  08/07/23   Assessment:   Perennial and seasonal allergic rhinitis (grasses, ragweed, weeds, trees, indoor molds, dust mites, dog, and cockroach)   Flexural atopic dermatitis - with features consistent with psoriasis as well (distribution of the rash on the extensor surfaces of the elbow)   Mild persistent asthma, uncomplicated   Allergic contact dermatitis - planning for patch testing  Plan/Recommendations:   1. Flexural atopic dermatitis - Continue with Protopic twice daily works!  - Continue with betamethasone as needed.  - Testing was slightly reactive to sweet potato and salmon.  - Copy of testing results provided.   2. Chronic rhinitis - Testing in the past has showed: grasses, ragweed, weeds, trees, indoor molds, dust mites, dog, and cockroach - Copy of test results provided.  - Avoidance measures provided. - Continue with: Singulair (montelukast) 10mg  daily  - Continue with: Xyzal (levocetirizine) 5mg  tablet TWICE daily - You can use an extra dose of the antihistamine, if needed, for breakthrough symptoms.  - Consider nasal saline rinses 1-2 times daily to remove allergens from the nasal cavities as well as help with mucous clearance (this is especially helpful to do before the nasal sprays are given) - Strongly consider allergy shots as a means of long-term control. - Allergy shots "re-train" and "reset" the immune system to ignore environmental allergens and decrease the resulting immune response to those allergens (sneezing, itchy watery eyes, runny nose, nasal congestion, etc).    - Allergy shots improve symptoms in 75-85% of patients.  - We can discuss more at the next appointment if the medications are not working for you.  3. Mild persistent asthma, uncomplicated - Lung testing not done. - Daily controller medication(s): Breo 100/42mcg one puff once daily - Prior to physical activity: albuterol 2 puffs 10-15 minutes  before physical activity. - Rescue medications: albuterol 4 puffs every 4-6 hours as needed - Asthma control goals:  * Full participation in all desired activities (may need albuterol before activity) * Albuterol use two time or less a week on average (not counting use with activity) * Cough interfering with sleep two time or less a month * Oral steroids no more than once a year * No hospitalizations   4. Allergic contact dermatitis - Continue to avoid all of your triggering chemicals.   5. Return in about 6 months (around 02/06/2024). You can have the follow up appointment with Dr. Dellis Anes or a Nurse Practicioner (our Nurse Practitioners are excellent and always have Physician oversight!).   Subjective:   Karla Villa is a 38 y.o. female presenting today for follow up of No chief complaint on file.   Karla Villa has a history of the following: Patient Active Problem List   Diagnosis Date Noted   Dermatitis venenata 07/21/2023   Mixed hyperlipidemia 09/22/2019   PCOS (polycystic ovarian syndrome) 09/01/2019   Excessive daytime sleepiness 09/01/2019   Non-restorative sleep 09/01/2019   Loud snoring 09/01/2019   High hematocrit without dehydration 09/01/2019   Class 3 severe obesity due to excess calories without serious comorbidity with body mass index (BMI) of 40.0 to 44.9 in adult [facility]) 09/01/2019   History of 2019 novel coronavirus disease (COVID-19) 08/09/2019   Insulin resistance 08/09/2019   Vitamin D deficiency 08/09/2019    History obtained from: chart review and patient.  Discussed the use of AI scribe software for clinical note transcription with the patient and/or guardian, who gave verbal consent to proceed.  Karla Villa  is a 37 y.o. female presenting for a follow up visit. She was lat seen earlier this year for patch testing, where she was positive to nickel, gold, and MCI/MI.   Since the last visit, she has done well. She is having abdominal pain which is  why she is here for testing.   Otherwise, there have been no changes to her past medical history, surgical history, family history, or social history.    Review of systems otherwise negative other than that mentioned in the HPI.    Objective:   There were no vitals taken for this visit. There is no height or weight on file to calculate BMI.    Physical Exam   Diagnostic studies:   Allergy Studies:     Food Adult Perc - 08/07/23 1300     Time Antigen Placed 0130    Allergen Manufacturer Waynette Buttery    Location Back    Number of allergen test 72     Control-buffer 50% Glycerol Negative    Control-Histamine 3+    1. Peanut Negative    2. Soybean Negative    3. Wheat Negative    4. Sesame Negative    5. Milk, Cow Negative    6. Casein Negative    7. Egg White, Chicken Negative    8. Shellfish Mix Negative    9. Fish Mix Negative    10. Cashew Negative    11. Walnut Food Negative    12. Almond Negative    13. Hazelnut Negative    14. Pecan Food Negative    15. Pistachio Negative    16. [place] Nut Negative    17. Coconut Negative    18. Trout Negative    19. Tuna Negative    20. Salmon --   +/-   21. Flounder Negative    22. Codfish Negative    23. Shrimp Negative    24. Crab Negative    25. Lobster Negative    26. Oyster Negative    27. Scallops Negative    28. Oat  Negative    29. Rice Negative    30. Barley Negative    31. Rye  Negative    32. Hops Negative    33. [place] Meat Negative    34. Chicken Meat Negative    35. Pork Negative    36. Beef Negative    37. Lamb Negative    38. Tomato Negative    39. White Potato Negative    40. Sweet Potato --   +/-   41. Pea, Green/English Negative    42. [place] Bean Negative    43. Green Beans Negative    44. Squash Negative    45. Green Pepper Negative    46. Mushrooms Negative    47. Onion Negative    48. Avocado Negative    49. Cabbage Negative    50. Carrots Negative    51. Celery Negative    52. Corn  Negative    53. Cucumber Negative    54. Grape (White seedless) Negative    55. Orange  Negative    56. Lemon Negative    57. Banana Negative    58. Apple Negative    59. Peach Negative    60. Strawberry Negative    61. Blueberry Negative    62. Cherry Negative    63. Cantaloupe Negative    64. Watermelon Negative    65. Pineapple Negative    66. Chocolate/Cacao Bean Negative  67. Cinnamon Negative    68. Nutmeg Negative    69. Ginger Negative    70. Garlic Negative    71. Pepper, Black Negative    72. Mustard Negative             Allergy testing results were read and interpreted by myself, documented by clinical staff.      Malachi Bonds, MD  [facility]

## 2023-08-07 NOTE — Patient Instructions (Addendum)
 1. Flexural atopic dermatitis - Continue with Protopic twice daily works!  - Continue with betamethasone as needed.  - Testing was slightly reactive to sweet potato and salmon.  - Copy of testing results provided.   2. Chronic rhinitis - Testing in the past has showed: grasses, ragweed, weeds, trees, indoor molds, dust mites, dog, and cockroach - Copy of test results provided.  - Avoidance measures provided. - Continue with: Singulair (montelukast) 10mg  daily  - Continue with: Xyzal (levocetirizine) 5mg  tablet TWICE daily - You can use an extra dose of the antihistamine, if needed, for breakthrough symptoms.  - Consider nasal saline rinses 1-2 times daily to remove allergens from the nasal cavities as well as help with mucous clearance (this is especially helpful to do before the nasal sprays are given) - Strongly consider allergy shots as a means of long-term control. - Allergy shots "re-train" and "reset" the immune system to ignore environmental allergens and decrease the resulting immune response to those allergens (sneezing, itchy watery eyes, runny nose, nasal congestion, etc).    - Allergy shots improve symptoms in 75-85% of patients.  - We can discuss more at the next appointment if the medications are not working for you.  3. Mild persistent asthma, uncomplicated - Lung testing not done. - Daily controller medication(s): Breo 100/52mcg one puff once daily - Prior to physical activity: albuterol 2 puffs 10-15 minutes before physical activity. - Rescue medications: albuterol 4 puffs every 4-6 hours as needed - Asthma control goals:  * Full participation in all desired activities (may need albuterol before activity) * Albuterol use two time or less a week on average (not counting use with activity) * Cough interfering with sleep two time or less a month * Oral steroids no more than once a year * No hospitalizations   4. Allergic contact dermatitis - Continue to avoid all of your  triggering chemicals.   5. Return in about 6 months (around 02/06/2024). You can have the follow up appointment with Dr. Dellis Anes or a Nurse Practicioner (our Nurse Practitioners are excellent and always have Physician oversight!).    Please inform us of any Emergency Department visits, hospitalizations, or changes in symptoms. Call us before going to the ED for breathing or allergy symptoms since we might be able to fit you in for a sick visit. Feel free to contact us anytime with any questions, problems, or concerns.  It was a pleasure to see you again today!  Websites that have reliable patient information: 1. American Academy of Asthma, Allergy, and Immunology: www.aaaai.org 2. Food Allergy Research and Education (FARE): foodallergy.org 3. Mothers of Asthmatics: http://www.asthmacommunitynetwork.org 4. American College of Allergy, Asthma, and Immunology: www.acaai.org      "Like" [place] on Facebook and Instagram for our latest updates!      A healthy democracy works best when [employer] participate! Make sure you are registered to vote! If you have moved or changed any of your contact information, you will need to get this updated before voting! Scan the QR codes below to learn more!       Food Adult Perc - 08/07/23 1300     Time Antigen Placed 0130    Allergen Manufacturer Waynette Buttery    Location Back    Number of allergen test 72     Control-buffer 50% Glycerol Negative    Control-Histamine 3+    1. Peanut Negative    2. Soybean Negative    3. Wheat Negative    4. Sesame  Negative    5. Milk, Cow Negative    6. Casein Negative    7. Egg White, Chicken Negative    8. Shellfish Mix Negative    9. Fish Mix Negative    10. Cashew Negative    11. Walnut Food Negative    12. Almond Negative    13. Hazelnut Negative    14. Pecan Food Negative    15. Pistachio Negative    16. [place] Nut Negative    17. Coconut Negative    18. Trout Negative    19. Tuna Negative    20. Salmon  --   +/-   21. Flounder Negative    22. Codfish Negative    23. Shrimp Negative    24. Crab Negative    25. Lobster Negative    26. Oyster Negative    27. Scallops Negative    28. Oat  Negative    29. Rice Negative    30. Barley Negative    31. Rye  Negative    32. Hops Negative    33. [place] Meat Negative    34. Chicken Meat Negative    35. Pork Negative    36. Beef Negative    37. Lamb Negative    38. Tomato Negative    39. White Potato Negative    40. Sweet Potato --   +/-   41. Pea, Green/English Negative    42. [place] Bean Negative    43. Green Beans Negative    44. Squash Negative    45. Green Pepper Negative    46. Mushrooms Negative    47. Onion Negative    48. Avocado Negative    49. Cabbage Negative    50. Carrots Negative    51. Celery Negative    52. Corn Negative    53. Cucumber Negative    54. Grape (White seedless) Negative    55. Orange  Negative    56. Lemon Negative    57. Banana Negative    58. Apple Negative    59. Peach Negative    60. Strawberry Negative    61. Blueberry Negative    62. Cherry Negative    63. Cantaloupe Negative    64. Watermelon Negative    65. Pineapple Negative    66. Chocolate/Cacao Bean Negative    67. Cinnamon Negative    68. Nutmeg Negative    69. Ginger Negative    70. Garlic Negative    71. Pepper, Black Negative    72. Mustard Negative

## 2023-08-08 ENCOUNTER — Encounter: Payer: Self-pay | Admitting: Allergy & Immunology

## 2023-09-25 ENCOUNTER — Ambulatory Visit: Admitting: Allergy & Immunology

## 2023-10-08 ENCOUNTER — Ambulatory Visit: Admitting: Dermatology

## 2024-02-12 ENCOUNTER — Encounter: Payer: Self-pay | Admitting: Allergy & Immunology

## 2024-02-12 ENCOUNTER — Ambulatory Visit: Admitting: Allergy & Immunology

## 2024-02-12 ENCOUNTER — Other Ambulatory Visit: Payer: Self-pay

## 2024-02-12 VITALS — BP 128/80 | HR 90 | Temp 98.2°F | Ht 64.0 in | Wt 251.4 lb

## 2024-02-12 DIAGNOSIS — J4530 Mild persistent asthma, uncomplicated: Secondary | ICD-10-CM

## 2024-02-12 DIAGNOSIS — J302 Other seasonal allergic rhinitis: Secondary | ICD-10-CM

## 2024-02-12 DIAGNOSIS — L508 Other urticaria: Secondary | ICD-10-CM

## 2024-02-12 DIAGNOSIS — J3089 Other allergic rhinitis: Secondary | ICD-10-CM | POA: Diagnosis not present

## 2024-02-12 DIAGNOSIS — L2089 Other atopic dermatitis: Secondary | ICD-10-CM | POA: Diagnosis not present

## 2024-02-12 MED ORDER — TACROLIMUS 0.1 % EX OINT: TOPICAL_OINTMENT | Freq: Two times a day (BID) | CUTANEOUS | 3 refills | Status: AC

## 2024-02-12 MED ORDER — LEVOCETIRIZINE DIHYDROCHLORIDE 5 MG PO TABS: 5.0000 mg | ORAL_TABLET | Freq: Two times a day (BID) | ORAL | 1 refills | Status: AC

## 2024-02-12 MED ORDER — BETAMETHASONE DIPROPIONATE 0.05 % EX CREA: TOPICAL_CREAM | Freq: Two times a day (BID) | CUTANEOUS | 3 refills | Status: AC

## 2024-02-12 MED ORDER — CLOBETASOL PROPIONATE 0.05 % EX OINT: 1.0000 | TOPICAL_OINTMENT | Freq: Two times a day (BID) | CUTANEOUS | 3 refills | Status: AC

## 2024-02-12 NOTE — Progress Notes (Unsigned)
 FOLLOW UP  Date of Service/Encounter:  02/12/24   Assessment:   Perennial and seasonal allergic rhinitis (grasses, ragweed, weeds, trees, indoor molds, dust mites, dog, and cockroach)   Flexural atopic dermatitis - with features consistent with psoriasis as well (distribution of the rash on the extensor surfaces of the elbow)   Mild persistent asthma, uncomplicated   Allergic contact dermatitis - planning for patch testing  Plan/Recommendations:   There are no Patient Instructions on file for this visit.   Subjective:   Karla Villa is a 38 y.o. female presenting today for follow up of  Chief Complaint  Patient presents with   Allergic Rhinitis     Better with medication   Asthma    Currently in a flare. Headache since Sunday, itchy eyes, SOB    Sheng E Lueth has a history of the following: Patient Active Problem List   Diagnosis Date Noted   Dermatitis venenata 07/21/2023   Mixed hyperlipidemia 09/22/2019   PCOS (polycystic ovarian syndrome) 09/01/2019   Excessive daytime sleepiness 09/01/2019   Non-restorative sleep 09/01/2019   Loud snoring 09/01/2019   High hematocrit without dehydration 09/01/2019   Class 3 severe obesity due to excess calories without serious comorbidity with body mass index (BMI) of 40.0 to 44.9 in adult [facility]) 09/01/2019   History of 2019 novel coronavirus disease (COVID-19) 08/09/2019   Insulin  resistance 08/09/2019   Vitamin D  deficiency 08/09/2019    History obtained from: chart review and {Persons; PED relatives w/patient:19415::patient}.  Discussed the use of AI scribe software for clinical note transcription with the patient and/or guardian, who gave verbal consent to proceed.  Karla Villa is a 38 y.o. female presenting for {Blank single:19197::a food challenge,a drug challenge,skin testing,a sick visit,an evaluation of ***,a follow up visit}.  She was last seen in April 2025.  At that time, we continue with  Protopic  twice a day as well as betamethasone.  She had testing that was slightly reacted to sweet potato and salmon.  For her rhinitis, we continue with Singulair  as well as Xyzal  twice a day.  Like testing was not done.  We continue with Breo 100 mcg 1 puff once daily as well as albuterol .  Since last visit,  Asthma/Respiratory Symptom History: ***  Allergic Rhinitis Symptom History: ***  Food Allergy  Symptom History: ***  Skin Symptom History: ***  GERD Symptom History: ***  Infection Symptom History: ***  Otherwise, there have been no changes to her past medical history, surgical history, family history, or social history.    Review of systems otherwise negative other than that mentioned in the HPI.    Objective:   Blood pressure 128/80, pulse 90, temperature 98.2 F (36.8 C), temperature source Temporal, height 5' 4 (1.626 m), weight 251 lb 6.4 oz (114 kg), SpO2 96%. Body mass index is 43.15 kg/m.    Physical Exam   Diagnostic studies:    Spirometry: results normal (FEV1: 3.02/100%, FVC: 4.66/127%, FEV1/FVC: 65%).    Spirometry consistent with normal pattern. {Blank single:19197::Albuterol /Atrovent nebulizer,Xopenex/Atrovent nebulizer,Albuterol  nebulizer,Albuterol  four puffs via MDI,Xopenex four puffs via MDI} treatment given in clinic with {Blank single:19197::significant improvement in FEV1 per ATS criteria,significant improvement in FVC per ATS criteria,significant improvement in FEV1 and FVC per ATS criteria,improvement in FEV1, but not significant per ATS criteria,improvement in FVC, but not significant per ATS criteria,improvement in FEV1 and FVC, but not significant per ATS criteria,no improvement}.  Allergy  Studies: {Blank single:19197::none,deferred due to recent antihistamine use,deferred due to insurance stipulations that require a  separate visit for testing,labs sent instead, }    {Blank single:19197::Allergy  testing  results were read and interpreted by myself, documented by clinical staff., }      Marty Shaggy, MD  [facility]  

## 2024-02-12 NOTE — Patient Instructions (Signed)
 1. Flexural atopic dermatitis - Continue with Protopic  twice daily works!  - Continue with betamethasone as needed.  - Go ahead and restart your Dupixent (no new loading dose until 3 months off of Dupixent).  - Tammy will take this over (she will call you about taking it over from [place]). - Continue to avoid sweet potato and salmon.   2. Chronic rhinitis - Testing in the past has showed: grasses, ragweed, weeds, trees, indoor molds, dust mites, dog, and cockroach - Continue with: Singulair  (montelukast ) 10mg  daily  - Continue with: Xyzal  (levocetirizine) 5mg  tablet TWICE daily - You can use an extra dose of the antihistamine, if needed, for breakthrough symptoms.  - Consider nasal saline rinses 1-2 times daily to remove allergens from the nasal cavities as well as help with mucous clearance (this is especially helpful to do before the nasal sprays are given) - Strongly consider allergy  shots as a means of long-term control. - Allergy  shots re-train and reset the immune system to ignore environmental allergens and decrease the resulting immune response to those allergens (sneezing, itchy watery eyes, runny nose, nasal congestion, etc).    - Allergy  shots improve symptoms in 75-85% of patients.   3. Mild persistent asthma, uncomplicated - Lung testing looks good today.  - Daily controller medication(s): Breo 100/28mcg one puff once daily + Dupixent every two weeks - Prior to physical activity: albuterol  2 puffs 10-15 minutes before physical activity. - Rescue medications: albuterol  4 puffs every 4-6 hours as needed - Asthma control goals:  * Full participation in all desired activities (may need albuterol  before activity) * Albuterol  use two time or less a week on average (not counting use with activity) * Cough interfering with sleep two time or less a month * Oral steroids no more than once a year * No hospitalizations   4. Allergic contact dermatitis - Continue to avoid all of your  triggering chemicals.   5. Urticaria - Your history does not have any red flags such as fevers, joint pains, or permanent skin changes that would be concerning for a more serious cause of hives.  - We will get some labs to rule out serious causes of hives: alpha gal panel, complete blood count, tryptase level, chronic urticaria panel, CMP, ESR, and CRP. - Chronic hives are often times a self limited process and will burn themselves out over 6-12 months, although this is not always the case.  - In the meantime, start suppressive dosing of antihistamines:   - Morning: Xyzal  (levocetirizine) 5mg  - 10mg    - Evening: Xyzal  (levocetirizine) 5mg  - 10mg   - If the above is not working, try adding: Pepcid  (famotidine ) 20mg  TWICE DAILY - You can change this dosing at home, decreasing the dose as needed or increasing the dosing as needed.  - If you are not tolerating the medications or are tired of taking them every day, we can start treatment with a monthly injectable medication called Xolair.   6. Return in about 6 months (around 08/12/2024). You can have the follow up appointment with Dr. Iva or a Nurse Practicioner (our Nurse Practitioners are excellent and always have Physician oversight!).    Please inform us  of any Emergency Department visits, hospitalizations, or changes in symptoms. Call us  before going to the ED for breathing or allergy  symptoms since we might be able to fit you in for a sick visit. Feel free to contact us  anytime with any questions, problems, or concerns.  It was a pleasure to  see you again today!  Websites that have reliable patient information: 1. American Academy of Asthma, Allergy , and Immunology: www.aaaai.org 2. Food Allergy  Research and Education (FARE): foodallergy.org 3. Mothers of Asthmatics: http://www.asthmacommunitynetwork.org 4. American College of Allergy , Asthma, and Immunology: www.acaai.org      "Like" us  on Facebook and Instagram for our latest  updates!      A healthy democracy works best when [employer] participate! Make sure you are registered to vote! If you have moved or changed any of your contact information, you will need to get this updated before voting! Scan the QR codes below to learn more!

## 2024-02-13 ENCOUNTER — Encounter: Payer: Self-pay | Admitting: Allergy & Immunology

## 2024-02-13 NOTE — Addendum Note (Signed)
 Addended by: Kambrey Hagger on: 02/13/2024 03:35 PM   Modules accepted: Orders

## 2024-02-16 ENCOUNTER — Telehealth: Payer: Self-pay | Admitting: *Deleted

## 2024-02-16 NOTE — Telephone Encounter (Signed)
-----   Message from Marty Morton Shaggy sent at 02/13/2024  8:35 AM EDT ----- Patient would like us  to take over Dupixent.  She gets it from [place].

## 2024-02-16 NOTE — Telephone Encounter (Signed)
 L/m for patient to reach out to me to advise her I will do the refills/ approvals going forward. I will need her Rx card info for same

## 2024-02-27 ENCOUNTER — Ambulatory Visit: Payer: Self-pay | Admitting: Allergy & Immunology

## 2024-02-27 LAB — CBC WITH DIFF/PLATELET
Basophils Absolute: 0 x10E3/uL (ref 0.0–0.2)
Basos: 1 %
EOS (ABSOLUTE): 0.2 x10E3/uL (ref 0.0–0.4)
Eos: 2 %
Hematocrit: 43 % (ref 34.0–46.6)
Hemoglobin: 14.7 g/dL (ref 11.1–15.9)
Immature Grans (Abs): 0 x10E3/uL (ref 0.0–0.1)
Immature Granulocytes: 0 %
Lymphocytes Absolute: 2.6 x10E3/uL (ref 0.7–3.1)
Lymphs: 31 %
MCH: 32.2 pg (ref 26.6–33.0)
MCHC: 34.2 g/dL (ref 31.5–35.7)
MCV: 94 fL (ref 79–97)
Monocytes Absolute: 0.7 x10E3/uL (ref 0.1–0.9)
Monocytes: 8 %
Neutrophils Absolute: 5 x10E3/uL (ref 1.4–7.0)
Neutrophils: 57 %
Platelets: 249 x10E3/uL (ref 150–450)
RBC: 4.56 x10E6/uL (ref 3.77–5.28)
RDW: 13.6 % (ref 11.7–15.4)
WBC: 8.5 x10E3/uL (ref 3.4–10.8)

## 2024-02-27 LAB — CMP14+EGFR
ALT: 35 IU/L — ABNORMAL HIGH (ref 0–32)
AST: 24 IU/L (ref 0–40)
Albumin: 4.4 g/dL (ref 3.9–4.9)
Alkaline Phosphatase: 71 IU/L (ref 41–116)
BUN/Creatinine Ratio: 17 (ref 9–23)
BUN: 14 mg/dL (ref 6–20)
Bilirubin Total: 0.4 mg/dL (ref 0.0–1.2)
CO2: 20 mmol/L (ref 20–29)
Calcium: 9.4 mg/dL (ref 8.7–10.2)
Chloride: 99 mmol/L (ref 96–106)
Creatinine, Ser: 0.84 mg/dL (ref 0.57–1.00)
Globulin, Total: 2.4 g/dL (ref 1.5–4.5)
Glucose: 93 mg/dL (ref 70–99)
Potassium: 4.1 mmol/L (ref 3.5–5.2)
Sodium: 136 mmol/L (ref 134–144)
Total Protein: 6.8 g/dL (ref 6.0–8.5)
eGFR: 92 mL/min/1.73 (ref >59)

## 2024-02-27 LAB — ALPHA-GAL PANEL
Allergen Lamb IgE: <0.1 kU/L
Beef IgE: <0.1 kU/L
IgE (Immunoglobulin E), Serum: 42 [IU]/mL (ref 6–495)
O215-IgE Alpha-Gal: <0.1 kU/L
Pork IgE: <0.1 kU/L

## 2024-02-27 LAB — SEDIMENTATION RATE: Sed Rate: 11 mm/h (ref 0–32)

## 2024-02-27 LAB — CHRONIC URTICARIA PD-BAT: Pooled Donor- BAT CU: 5.92 % (ref 0.00–10.60)

## 2024-02-27 LAB — C-REACTIVE PROTEIN: CRP: 6 mg/L (ref 0–10)

## 2024-02-27 LAB — ANTINUCLEAR ANTIBODIES, IFA: ANA Titer 1: NEGATIVE

## 2024-02-27 LAB — THYROID ANTIBODIES (THYROPEROXIDASE & THYROGLOBULIN)
Thyroglobulin Antibody: <1 [IU]/mL (ref 0.0–0.9)
Thyroperoxidase Ab SerPl-aCnc: <9 [IU]/mL (ref 0–34)

## 2024-02-27 LAB — TRYPTASE: Tryptase: 3.4 ug/L (ref 2.2–13.2)

## 2024-04-29 ENCOUNTER — Other Ambulatory Visit: Payer: Self-pay | Admitting: Allergy & Immunology

## 2024-08-12 ENCOUNTER — Ambulatory Visit: Admitting: Allergy & Immunology
